# Patient Record
Sex: Female | Born: 1965 | Race: White | Hispanic: No | Smoking: Never smoker
Health system: Southern US, Community
[De-identification: ages and names within clinical notes are randomized; demographics above are authoritative.]

## PROBLEM LIST (undated history)

## (undated) DIAGNOSIS — R0600 Dyspnea, unspecified: Secondary | ICD-10-CM

## (undated) DIAGNOSIS — D649 Anemia, unspecified: Secondary | ICD-10-CM

## (undated) DIAGNOSIS — F32A Depression, unspecified: Secondary | ICD-10-CM

## (undated) DIAGNOSIS — M1990 Unspecified osteoarthritis, unspecified site: Secondary | ICD-10-CM

## (undated) DIAGNOSIS — E039 Hypothyroidism, unspecified: Secondary | ICD-10-CM

## (undated) DIAGNOSIS — G4730 Sleep apnea, unspecified: Secondary | ICD-10-CM

## (undated) DIAGNOSIS — F4310 Post-traumatic stress disorder, unspecified: Secondary | ICD-10-CM

## (undated) DIAGNOSIS — K769 Liver disease, unspecified: Secondary | ICD-10-CM

## (undated) DIAGNOSIS — E079 Disorder of thyroid, unspecified: Secondary | ICD-10-CM

## (undated) HISTORY — PX: HAND TENDON SURGERY: SHX663

## (undated) HISTORY — PX: CHOLECYSTECTOMY: SHX55

## (undated) HISTORY — PX: ABDOMINAL HYSTERECTOMY: SHX81

## (undated) HISTORY — PX: TONSILLECTOMY: SUR1361

---

## 2000-05-18 DIAGNOSIS — K769 Liver disease, unspecified: Secondary | ICD-10-CM

## 2000-05-18 HISTORY — DX: Liver disease, unspecified: K76.9

## 2017-08-20 DIAGNOSIS — K766 Portal hypertension: Secondary | ICD-10-CM | POA: Insufficient documentation

## 2018-04-11 HISTORY — PX: COLONOSCOPY: SHX5424

## 2018-05-18 DIAGNOSIS — K7460 Unspecified cirrhosis of liver: Secondary | ICD-10-CM

## 2018-05-18 HISTORY — DX: Unspecified cirrhosis of liver: K74.60

## 2018-09-29 DIAGNOSIS — R413 Other amnesia: Secondary | ICD-10-CM

## 2018-09-29 HISTORY — DX: Other amnesia: R41.3

## 2019-09-22 DIAGNOSIS — F418 Other specified anxiety disorders: Secondary | ICD-10-CM | POA: Diagnosis present

## 2019-10-24 DIAGNOSIS — K759 Inflammatory liver disease, unspecified: Secondary | ICD-10-CM

## 2019-10-24 HISTORY — DX: Inflammatory liver disease, unspecified: K75.9

## 2021-04-11 ENCOUNTER — Encounter (HOSPITAL_COMMUNITY): Payer: Self-pay

## 2021-04-11 ENCOUNTER — Emergency Department (HOSPITAL_COMMUNITY): Payer: Medicare Other

## 2021-04-11 ENCOUNTER — Other Ambulatory Visit: Payer: Self-pay

## 2021-04-11 ENCOUNTER — Inpatient Hospital Stay (HOSPITAL_COMMUNITY)
Admission: EM | Admit: 2021-04-11 | Discharge: 2021-04-15 | DRG: 441 | Disposition: A | Discharge status: Home / Self Care | Payer: Medicare Other | Attending: Internal Medicine | Admitting: Internal Medicine

## 2021-04-11 ENCOUNTER — Observation Stay (HOSPITAL_COMMUNITY): Payer: Medicare Other

## 2021-04-11 DIAGNOSIS — K7682 Hepatic encephalopathy: Principal | ICD-10-CM

## 2021-04-11 DIAGNOSIS — Z885 Allergy status to narcotic agent status: Secondary | ICD-10-CM | POA: Diagnosis not present

## 2021-04-11 DIAGNOSIS — R252 Cramp and spasm: Secondary | ICD-10-CM | POA: Diagnosis not present

## 2021-04-11 DIAGNOSIS — K754 Autoimmune hepatitis: Secondary | ICD-10-CM | POA: Diagnosis present

## 2021-04-11 DIAGNOSIS — Z79890 Hormone replacement therapy (postmenopausal): Secondary | ICD-10-CM | POA: Diagnosis not present

## 2021-04-11 DIAGNOSIS — R278 Other lack of coordination: Secondary | ICD-10-CM | POA: Diagnosis present

## 2021-04-11 DIAGNOSIS — I517 Cardiomegaly: Secondary | ICD-10-CM | POA: Diagnosis not present

## 2021-04-11 DIAGNOSIS — M1990 Unspecified osteoarthritis, unspecified site: Secondary | ICD-10-CM | POA: Diagnosis not present

## 2021-04-11 DIAGNOSIS — K7460 Unspecified cirrhosis of liver: Secondary | ICD-10-CM | POA: Diagnosis present

## 2021-04-11 DIAGNOSIS — D696 Thrombocytopenia, unspecified: Secondary | ICD-10-CM

## 2021-04-11 DIAGNOSIS — E876 Hypokalemia: Secondary | ICD-10-CM | POA: Diagnosis not present

## 2021-04-11 DIAGNOSIS — R410 Disorientation, unspecified: Secondary | ICD-10-CM | POA: Diagnosis not present

## 2021-04-11 DIAGNOSIS — Z90710 Acquired absence of both cervix and uterus: Secondary | ICD-10-CM | POA: Diagnosis not present

## 2021-04-11 DIAGNOSIS — Z8249 Family history of ischemic heart disease and other diseases of the circulatory system: Secondary | ICD-10-CM

## 2021-04-11 DIAGNOSIS — I959 Hypotension, unspecified: Secondary | ICD-10-CM | POA: Diagnosis present

## 2021-04-11 DIAGNOSIS — Z886 Allergy status to analgesic agent status: Secondary | ICD-10-CM

## 2021-04-11 DIAGNOSIS — Y92009 Unspecified place in unspecified non-institutional (private) residence as the place of occurrence of the external cause: Secondary | ICD-10-CM | POA: Diagnosis not present

## 2021-04-11 DIAGNOSIS — W19XXXA Unspecified fall, initial encounter: Secondary | ICD-10-CM | POA: Diagnosis present

## 2021-04-11 DIAGNOSIS — K7290 Hepatic failure, unspecified without coma: Secondary | ICD-10-CM | POA: Diagnosis not present

## 2021-04-11 DIAGNOSIS — E039 Hypothyroidism, unspecified: Secondary | ICD-10-CM | POA: Diagnosis present

## 2021-04-11 DIAGNOSIS — Z9109 Other allergy status, other than to drugs and biological substances: Secondary | ICD-10-CM

## 2021-04-11 DIAGNOSIS — R531 Weakness: Secondary | ICD-10-CM | POA: Diagnosis present

## 2021-04-11 DIAGNOSIS — I4891 Unspecified atrial fibrillation: Secondary | ICD-10-CM | POA: Diagnosis not present

## 2021-04-11 DIAGNOSIS — R296 Repeated falls: Secondary | ICD-10-CM | POA: Diagnosis not present

## 2021-04-11 DIAGNOSIS — R9431 Abnormal electrocardiogram [ECG] [EKG]: Secondary | ICD-10-CM | POA: Diagnosis not present

## 2021-04-11 DIAGNOSIS — S5011XA Contusion of right forearm, initial encounter: Secondary | ICD-10-CM | POA: Diagnosis not present

## 2021-04-11 DIAGNOSIS — D6959 Other secondary thrombocytopenia: Secondary | ICD-10-CM | POA: Diagnosis not present

## 2021-04-11 DIAGNOSIS — M79621 Pain in right upper arm: Secondary | ICD-10-CM | POA: Diagnosis not present

## 2021-04-11 DIAGNOSIS — U071 COVID-19: Secondary | ICD-10-CM | POA: Diagnosis present

## 2021-04-11 DIAGNOSIS — Z6841 Body Mass Index (BMI) 40.0 and over, adult: Secondary | ICD-10-CM | POA: Diagnosis not present

## 2021-04-11 DIAGNOSIS — I480 Paroxysmal atrial fibrillation: Secondary | ICD-10-CM | POA: Diagnosis not present

## 2021-04-11 DIAGNOSIS — D72810 Lymphocytopenia: Secondary | ICD-10-CM | POA: Diagnosis present

## 2021-04-11 HISTORY — DX: Disorder of thyroid, unspecified: E07.9

## 2021-04-11 HISTORY — DX: Post-traumatic stress disorder, unspecified: F43.10

## 2021-04-11 HISTORY — DX: Depression, unspecified: F32.A

## 2021-04-11 HISTORY — DX: Unspecified osteoarthritis, unspecified site: M19.90

## 2021-04-11 HISTORY — DX: Liver disease, unspecified: K76.9

## 2021-04-11 LAB — APTT: aPTT: 34 seconds (ref 24–36)

## 2021-04-11 LAB — URINALYSIS, ROUTINE W REFLEX MICROSCOPIC
Bacteria, UA: NONE SEEN
Bilirubin Urine: NEGATIVE
Glucose, UA: NEGATIVE mg/dL
Ketones, ur: NEGATIVE mg/dL
Leukocytes,Ua: NEGATIVE
Nitrite: NEGATIVE
Protein, ur: NEGATIVE mg/dL
Specific Gravity, Urine: 1.006 (ref 1.005–1.030)
pH: 6 (ref 5.0–8.0)

## 2021-04-11 LAB — CBC WITH DIFFERENTIAL/PLATELET
Abs Immature Granulocytes: 0.01 10*3/uL (ref 0.00–0.07)
Basophils Absolute: 0 10*3/uL (ref 0.0–0.1)
Basophils Relative: 1 %
Eosinophils Absolute: 0.1 10*3/uL (ref 0.0–0.5)
Eosinophils Relative: 6 %
HCT: 39.1 % (ref 36.0–46.0)
Hemoglobin: 13.3 g/dL (ref 12.0–15.0)
Immature Granulocytes: 0 %
Lymphocytes Relative: 27 %
Lymphs Abs: 0.6 10*3/uL — ABNORMAL LOW (ref 0.7–4.0)
MCH: 32.5 pg (ref 26.0–34.0)
MCHC: 34 g/dL (ref 30.0–36.0)
MCV: 95.6 fL (ref 80.0–100.0)
Monocytes Absolute: 0.3 10*3/uL (ref 0.1–1.0)
Monocytes Relative: 11 %
Neutro Abs: 1.3 10*3/uL — ABNORMAL LOW (ref 1.7–7.7)
Neutrophils Relative %: 55 %
Platelets: 108 10*3/uL — ABNORMAL LOW (ref 150–400)
RBC: 4.09 MIL/uL (ref 3.87–5.11)
RDW: 13.7 % (ref 11.5–15.5)
WBC: 2.3 10*3/uL — ABNORMAL LOW (ref 4.0–10.5)
nRBC: 0 % (ref 0.0–0.2)

## 2021-04-11 LAB — COMPREHENSIVE METABOLIC PANEL
ALT: 36 U/L (ref 0–44)
AST: 66 U/L — ABNORMAL HIGH (ref 15–41)
Albumin: 3 g/dL — ABNORMAL LOW (ref 3.5–5.0)
Alkaline Phosphatase: 76 U/L (ref 38–126)
Anion gap: 6 (ref 5–15)
BUN: 10 mg/dL (ref 6–20)
CO2: 24 mmol/L (ref 22–32)
Calcium: 8.1 mg/dL — ABNORMAL LOW (ref 8.9–10.3)
Chloride: 109 mmol/L (ref 98–111)
Creatinine, Ser: 1.02 mg/dL — ABNORMAL HIGH (ref 0.44–1.00)
GFR, Estimated: >60 mL/min (ref >60)
Glucose, Bld: 105 mg/dL — ABNORMAL HIGH (ref 70–99)
Potassium: 3.2 mmol/L — ABNORMAL LOW (ref 3.5–5.1)
Sodium: 139 mmol/L (ref 135–145)
Total Bilirubin: 1.8 mg/dL — ABNORMAL HIGH (ref 0.3–1.2)
Total Protein: 6.9 g/dL (ref 6.5–8.1)

## 2021-04-11 LAB — MAGNESIUM: Magnesium: 2.1 mg/dL (ref 1.7–2.4)

## 2021-04-11 LAB — RESP PANEL BY RT-PCR (FLU A&B, COVID) ARPGX2
Influenza A by PCR: NEGATIVE
Influenza B by PCR: NEGATIVE
SARS Coronavirus 2 by RT PCR: POSITIVE — AB

## 2021-04-11 LAB — PROTIME-INR
INR: 1.2 (ref 0.8–1.2)
Prothrombin Time: 15.1 seconds (ref 11.4–15.2)

## 2021-04-11 LAB — AMMONIA: Ammonia: 130 umol/L — ABNORMAL HIGH (ref 9–35)

## 2021-04-11 LAB — RAPID URINE DRUG SCREEN, HOSP PERFORMED
Amphetamines: NOT DETECTED
Barbiturates: NOT DETECTED
Benzodiazepines: NOT DETECTED
Cocaine: NOT DETECTED
Opiates: NOT DETECTED
Tetrahydrocannabinol: NOT DETECTED

## 2021-04-11 LAB — TSH: TSH: 3.974 u[IU]/mL (ref 0.350–4.500)

## 2021-04-11 LAB — LACTIC ACID, PLASMA: Lactic Acid, Venous: 1.6 mmol/L (ref 0.5–1.9)

## 2021-04-11 MED ORDER — LACTULOSE 10 GM/15ML PO SOLN
30.0000 g | Freq: Three times a day (TID) | ORAL | Status: DC
  Administered 2021-04-12 – 2021-04-15 (×10): 30 g via ORAL
  Filled 2021-04-11: qty 60
  Filled 2021-04-11 (×4): qty 45
  Filled 2021-04-11: qty 60
  Filled 2021-04-11 (×4): qty 45

## 2021-04-11 MED ORDER — LACTULOSE 10 GM/15ML PO SOLN
20.0000 g | Freq: Once | ORAL | Status: AC
  Administered 2021-04-11: 20 g via ORAL
  Filled 2021-04-11: qty 30

## 2021-04-11 MED ORDER — LEVOTHYROXINE SODIUM 150 MCG PO TABS
150.0000 ug | ORAL_TABLET | Freq: Every day | ORAL | Status: DC
  Administered 2021-04-12 – 2021-04-15 (×4): 150 ug via ORAL
  Filled 2021-04-11 (×2): qty 2
  Filled 2021-04-11: qty 1
  Filled 2021-04-11: qty 2

## 2021-04-11 MED ORDER — AZATHIOPRINE 50 MG PO TABS
50.0000 mg | ORAL_TABLET | Freq: Every day | ORAL | Status: DC
Start: 2021-04-12 — End: 2021-04-12
  Administered 2021-04-12: 50 mg via ORAL
  Filled 2021-04-11: qty 1

## 2021-04-11 MED ORDER — SODIUM CHLORIDE 0.9 % IV BOLUS
1000.0000 mL | Freq: Once | INTRAVENOUS | Status: AC
  Administered 2021-04-11: 1000 mL via INTRAVENOUS

## 2021-04-11 MED ORDER — RIFAXIMIN 200 MG PO TABS
200.0000 mg | ORAL_TABLET | Freq: Two times a day (BID) | ORAL | Status: DC
  Administered 2021-04-12 (×2): 200 mg via ORAL
  Filled 2021-04-11 (×2): qty 1

## 2021-04-11 MED ORDER — POTASSIUM CHLORIDE CRYS ER 20 MEQ PO TBCR
40.0000 meq | EXTENDED_RELEASE_TABLET | Freq: Once | ORAL | Status: AC
  Administered 2021-04-12: 40 meq via ORAL
  Filled 2021-04-11: qty 2

## 2021-04-11 NOTE — H&P (Signed)
History and Physical    Connie Larsen EXB:[id number] DOB: [birth year] DOA: 04/11/2021  PCP: System, Provider Not In  Patient coming from: Home.  Chief Complaint: Confusion.  History obtained from patient's daughter, patient and ER physician.  HPI: Connie Larsen is a 55 y.o. female with history of cirrhosis of liver and autoimmune hepatitis, hypothyroidism who has recently moved in from [place] to [place] was found this morning to be confused by patient's daughter.  Later in the evening when patient's daughter went to check on her again she was not talking well and had some slurred speech in addition to confusion.  At this point patient's daughter decided to bring patient to the ER.  Patient as per the daughter has not taken her medicines for last 3 days.  Has not had any nausea vomiting abdominal pain fever chills chest pain or shortness of breath.  ED Course: In the ER patient appears nonfocal CT head and MRI brain with nothing acute.  EKG shows A. fib which is new onset.  Patient is also incidentally positive for COVID.  Labs show ammonia level of 130 total bilirubin 1.8 AST of 66 platelets 108 and WBC 2.3.  Patient was started on lactulose admitted for acute hepatic encephalopathy.  Patient also was given potassium replacement for low potassium.  Review of Systems: As per HPI, rest all negative.   Past Medical History:  Diagnosis Date   Arthritis    Depression    Liver disease    PTSD (post-traumatic stress disorder)    Thyroid disease     Past Surgical History:  Procedure Laterality Date   ABDOMINAL HYSTERECTOMY       reports that she has never smoked. She has never used smokeless tobacco. She reports that she does not drink alcohol and does not use drugs.  Allergies  Allergen Reactions   Tramadol Hcl Anaphylaxis   Aspirin    Morphine And Related    Tylenol [Acetaminophen]     History reviewed. No pertinent family history.  Prior to Admission medications    Not on File    Physical Exam: Constitutional: Moderately built and nourished. Vitals:   04/11/21 1935 04/11/21 2000 04/11/21 2030 04/11/21 2100  BP: 117/76 (!) 132/116 139/67 127/84  Pulse: (!) 110 (!) 116 (!) 105 (!) 120  Resp: 18 16 14 20   Temp:      TempSrc:      SpO2: 100% 98% 100% 100%  Weight:      Height:       Eyes: Anicteric no pallor. ENMT: No discharge from the ears eyes nose and mouth. Neck: No mass felt.  No neck rigidity. Respiratory: No rhonchi or crepitations. Cardiovascular: S1-S2 heard. Abdomen: Soft nontender bowel sound present. Musculoskeletal: No edema. Skin: No rash. Neurologic: Alert awake oriented to her name.  Some difficulty talking.  Moves all extremities.  Pupils are equal and reactive to light. Psychiatric: Oriented to name.   Labs on Admission: I have personally reviewed following labs and imaging studies  CBC: Recent Labs  Lab 04/11/21 1741  WBC 2.3*  NEUTROABS 1.3*  HGB 13.3  HCT 39.1  MCV 95.6  PLT 108*   Basic Metabolic Panel: Recent Labs  Lab 04/11/21 1747  NA 139  K 3.2*  CL 109  CO2 24  GLUCOSE 105*  BUN 10  CREATININE 1.02*  CALCIUM 8.1*  MG 2.1   GFR: Estimated Creatinine Clearance: 91 mL/min (A) (by C-G formula based on SCr of 1.02 mg/dL (H)).  Liver Function Tests: Recent Labs  Lab 04/11/21 1747  AST 66*  ALT 36  ALKPHOS 76  BILITOT 1.8*  PROT 6.9  ALBUMIN 3.0*   No results for input(s): LIPASE, AMYLASE in the last 168 hours. Recent Labs  Lab 04/11/21 1746  AMMONIA 130*   Coagulation Profile: Recent Labs  Lab 04/11/21 1741  INR 1.2   Cardiac Enzymes: No results for input(s): CKTOTAL, CKMB, CKMBINDEX, TROPONINI in the last 168 hours. BNP (last 3 results) No results for input(s): PROBNP in the last 8760 hours. HbA1C: No results for input(s): HGBA1C in the last 72 hours. CBG: No results for input(s): GLUCAP in the last 168 hours. Lipid Profile: No results for input(s): CHOL, HDL, LDLCALC,  TRIG, CHOLHDL, LDLDIRECT in the last 72 hours. Thyroid Function Tests: Recent Labs    04/11/21 1741  TSH 3.974   Anemia Panel: No results for input(s): VITAMINB12, FOLATE, FERRITIN, TIBC, IRON, RETICCTPCT in the last 72 hours. Urine analysis:    Component Value Date/Time   COLORURINE YELLOW 04/11/2021 1746   APPEARANCEUR CLEAR 04/11/2021 1746   LABSPEC 1.006 04/11/2021 1746   PHURINE 6.0 04/11/2021 1746   GLUCOSEU NEGATIVE 04/11/2021 1746   HGBUR SMALL (A) 04/11/2021 1746   BILIRUBINUR NEGATIVE 04/11/2021 1746   KETONESUR NEGATIVE 04/11/2021 1746   PROTEINUR NEGATIVE 04/11/2021 1746   NITRITE NEGATIVE 04/11/2021 1746   LEUKOCYTESUR NEGATIVE 04/11/2021 1746   Sepsis Labs: @LABRCNTIP (procalcitonin:4,lacticidven:4) )No results found for this or any previous visit (from the past 240 hour(s)).   Radiological Exams on Admission: DG Chest 2 View  Result Date: 04/11/2021 CLINICAL DATA:  Altered mental status EXAM: CHEST - 2 VIEW COMPARISON:  None. FINDINGS: Borderline cardiomegaly. No focal opacity, pleural effusion or pneumothorax. IMPRESSION: No active cardiopulmonary disease. Electronically Signed   By: 04/13/2021 M.D.   On: 04/11/2021 19:10   CT HEAD WO CONTRAST (04/13/2021)  Result Date: 04/11/2021 CLINICAL DATA:  Delirium EXAM: CT HEAD WITHOUT CONTRAST TECHNIQUE: Contiguous axial images were obtained from the base of the skull through the vertex without intravenous contrast. COMPARISON:  None. FINDINGS: Brain: No evidence of acute infarction, hemorrhage, hydrocephalus, extra-axial collection or mass lesion/mass effect. Vascular: No hyperdense vessel or unexpected calcification. Skull: Normal. Negative for fracture or focal lesion. Sinuses/Orbits: No acute finding. Lobulated mucosal thickening in the left maxillary sinus. Other: None IMPRESSION: Negative non contrasted CT appearance of the brain Electronically Signed   By: 04/13/2021 M.D.   On: 04/11/2021 19:09    EKG:  Independently reviewed.  A. fib rate around 100 bpm.  Assessment/Plan Principal Problem:   Hepatic encephalopathy Active Problems:   Cirrhosis of liver (HCC)   Hypothyroidism    Hepatic encephalopathy with known history of cirrhosis of liver with history of autoimmune hepatitis -patient has been placed on lactulose and has not restarted on Xifaxan.  At this time patient is afebrile and no definite signs of any ascites.  Closely monitor mental status.  Follow metabolic panel. Mild hypokalemia potassium replacement was given.  Recheck metabolic panel. A. fib appears to be new onset.  Will check 2D echo.  Patient has had recent falls with large bruise on her right forearm.  Estrogen restart anticoagulation at this time.  We will keep patient on low-dose beta-blockers. COVID-19 positive incidentally positive patient afebrile.  We will keep patient on precautions.  Will start remdesivir if patient's daughter agrees. Hypothyroidism on Synthroid.  TSH is 3.9. Autoimmune hepatitis takes azathioprine. Mildly elevated AST likely from autoimmune hepatitis follow LFTs.  Leukopenia and thrombocytopenia could be from cirrhosis.  Follow CBC. Large bruise on the right arm from recent fall.  Will check x-ray.  We will get physical therapy consult.   DVT prophylaxis: SCDs. Code Status: Full code. Family Communication: Patient's daughter. Disposition Plan: Home. Consults called: Physical therapy. Admission status: Observation.   Eduard Clos MD [facility] Pager [phone].  If 7PM-7AM, please contact night-coverage [email or web address] Password [facility]  04/11/2021, 10:17 PM

## 2021-04-11 NOTE — ED Provider Notes (Addendum)
Emergency Medicine Provider Triage Evaluation Note  Connie Larsen , a 55 y.o. female  was evaluated in triage.  Pt complains of difficulty speaking.  Apparently called her daughter in 15 and this morning was not feeling right.  Proxy 1 hour prior to arrival she called her daughter stating she had abrupt onset of difficulty speaking.  She denies any unilateral weakness.  Has tremors to bilateral hands.  States she has not taken her thyroid medicine over the last 3 days.  Hx of liver disease. On lactulose   Review of Systems  Positive: Difficulty speaking, tremors Negative:   Physical Exam  BP (!) 144/95 (BP Location: Left Arm)   Pulse (!) 127   Temp 97.9 F (36.6 C) (Oral)   Resp 18   Ht 5\' 7"  (1.702 m)   Wt 136.1 kg   SpO2 99%   BMI 46.99 kg/m  Gen:   Awake, no distress   Resp:  Normal effort  MSK:   Moves extremities without difficulty, tremors to bilateral upper extremities Neuro:  Slight weakness to upper extremities Other:    Medical Decision Making  Medically screening exam initiated at 5:41 PM.  Appropriate orders placed.  was informed that the remainder of the evaluation will be completed by another provider, this initial triage assessment does not replace that evaluation, and the importance of remaining in the ED until their evaluation is complete.  Attending Dr. Elana Alm to bedside to assess   No code stroke with get AMS WU    Charm Barges A, PA-C 04/11/21 1747    04/13/21, MD 04/12/21 1047

## 2021-04-11 NOTE — ED Provider Notes (Signed)
[facility]-EMERGENCY DEPT Provider Note   CSN: [id number] Arrival date & time: 04/11/21  1718     History No chief complaint on file.   Connie Larsen is a 55 y.o. female.  Pt presents to the ED today with difficulty speaking.  Pt lives alone and has had sx for the past day.  The pt's daughter called her this morning and pt was having some trouble speaking.  Later, the daughter called back and sx were worse, so she brought her here.  Pt has not taken her thyroid medication in 3 days because she forgot to take it.  Per daughter, pt has been like this in the past when she's forgotten to take her thyroid meds.  Pt feels numbness in both hands.  Daughter said she looked off balance when she walked.    Pt has recently moved here from [place] and has never been here.  Luckily, her daughter has brought an updated med list.  It looks like pt has a hx of liver problems.      Past Medical History:  Diagnosis Date   Arthritis    Depression    Liver disease    PTSD (post-traumatic stress disorder)    Stroke [facility])    Thyroid disease     Patient Active Problem List   Diagnosis Date Noted   Hepatic encephalopathy 04/11/2021     History reviewed. No pertinent surgical history.   OB History   No obstetric history on file.     History reviewed. No pertinent family history.  Social History   Tobacco Use   Smoking status: Never   Smokeless tobacco: Never  Vaping Use   Vaping Use: Never used  Substance Use Topics   Alcohol use: Never   Drug use: Never    Home Medications Prior to Admission medications   Not on File     Allergies    Tramadol hcl, Aspirin, Morphine and related, and Tylenol [acetaminophen]  Review of Systems   Review of Systems  Neurological:  Positive for dizziness, speech difficulty and weakness.  All other systems reviewed and are negative.  Physical Exam Updated Vital Signs BP (!) 132/116   Pulse (!) 116   Temp 97.9 F  (36.6 C) (Oral)   Resp 16   Ht 5\' 7"  (1.702 m)   Wt 136.1 kg   SpO2 98%   BMI 46.99 kg/m   Physical Exam Vitals and nursing note reviewed.  Constitutional:      Appearance: Normal appearance.  HENT:     Head: Normocephalic and atraumatic.     Right Ear: External ear normal.     Left Ear: External ear normal.     Nose: Nose normal.     Mouth/Throat:     Mouth: Mucous membranes are dry.  Eyes:     Extraocular Movements: Extraocular movements intact.     Conjunctiva/sclera: Conjunctivae normal.     Pupils: Pupils are equal, round, and reactive to light.  Cardiovascular:     Rate and Rhythm: Regular rhythm. Tachycardia present.     Pulses: Normal pulses.     Heart sounds: Normal heart sounds.  Pulmonary:     Effort: Pulmonary effort is normal.     Breath sounds: Normal breath sounds.  Abdominal:     General: Abdomen is flat. Bowel sounds are normal.     Palpations: Abdomen is soft.  Musculoskeletal:        General: Normal range of motion.  Cervical back: Normal range of motion and neck supple.  Skin:    General: Skin is warm.     Capillary Refill: Capillary refill takes less than 2 seconds.  Neurological:     General: No focal deficit present.     Mental Status: She is alert and oriented to person, place, and time.     Comments: Pt is able to speak, but it takes her a long time to get out her words.  Tremors in both arms.  Psychiatric:        Mood and Affect: Mood normal.    ED Results / Procedures / Treatments   Labs (all labs ordered are listed, but only abnormal results are displayed) Labs Reviewed  CBC WITH DIFFERENTIAL/PLATELET - Abnormal; Notable for the following components:      Result Value   WBC 2.3 (*)    Platelets 108 (*)    Neutro Abs 1.3 (*)    Lymphs Abs 0.6 (*)    All other components within normal limits  AMMONIA - Abnormal; Notable for the following components:   Ammonia 130 (*)    All other components within normal limits   COMPREHENSIVE METABOLIC PANEL - Abnormal; Notable for the following components:   Potassium 3.2 (*)    Glucose, Bld 105 (*)    Creatinine, Ser 1.02 (*)    Calcium 8.1 (*)    Albumin 3.0 (*)    AST 66 (*)    Total Bilirubin 1.8 (*)    All other components within normal limits  RESP PANEL BY RT-PCR (FLU A&B, COVID) ARPGX2  PROTIME-INR  APTT  TSH  LACTIC ACID, PLASMA  T4, FREE  URINALYSIS, ROUTINE W REFLEX MICROSCOPIC  RAPID URINE DRUG SCREEN, HOSP PERFORMED  LACTIC ACID, PLASMA  MAGNESIUM    EKG EKG Interpretation  Date/Time:  Friday April 11 2021 18:58:35 EST Ventricular Rate:  113 PR Interval:    QRS Duration: 91 QT Interval:  361 QTC Calculation: 495 R Axis:   76 Text Interpretation: Atrial fibrillation Ventricular premature complex Low voltage, precordial leads Borderline T abnormalities, diffuse leads Borderline prolonged QT interval No old tracing to compare Confirmed by Isla Pence [phone]) on 04/11/2021 7:36:03 PM  Radiology DG Chest 2 View  Result Date: 04/11/2021 CLINICAL DATA:  Altered mental status EXAM: CHEST - 2 VIEW COMPARISON:  None. FINDINGS: Borderline cardiomegaly. No focal opacity, pleural effusion or pneumothorax. IMPRESSION: No active cardiopulmonary disease. Electronically Signed   By: Donavan Foil M.D.   On: 04/11/2021 19:10   CT HEAD WO CONTRAST (5MM)  Result Date: 04/11/2021 CLINICAL DATA:  Delirium EXAM: CT HEAD WITHOUT CONTRAST TECHNIQUE: Contiguous axial images were obtained from the base of the skull through the vertex without intravenous contrast. COMPARISON:  None. FINDINGS: Brain: No evidence of acute infarction, hemorrhage, hydrocephalus, extra-axial collection or mass lesion/mass effect. Vascular: No hyperdense vessel or unexpected calcification. Skull: Normal. Negative for fracture or focal lesion. Sinuses/Orbits: No acute finding. Lobulated mucosal thickening in the left maxillary sinus. Other: None IMPRESSION: Negative non  contrasted CT appearance of the brain Electronically Signed   By: Donavan Foil M.D.   On: 04/11/2021 19:09    Procedures Procedures   Medications Ordered in ED Medications  potassium chloride SA (KLOR-CON) CR tablet 40 mEq (has no administration in time range)  lactulose (CHRONULAC) 10 GM/15ML solution 20 g (has no administration in time range)  sodium chloride 0.9 % bolus 1,000 mL (1,000 mLs Intravenous New Bag/Given 04/11/21 1935)    ED  Course  I have reviewed the triage vital signs and the nursing notes.  Pertinent labs & imaging results that were available during my care of the patient were reviewed by me and considered in my medical decision making (see chart for details).    MDM Rules/Calculators/A&P                           Pt's AMS is due to hepatic encephalopathy.  No lateralizing symptoms to make me think she needs a MRI to r/o stroke.  CT neg.  Pt given lactulose for her elevated ammonia.  K is low, so she is given Kdur as well.  Pt is d/w Dr. Hal Hope ([facility]) for admission.   Final Clinical Impression(s) / ED Diagnoses Final diagnoses:  Hepatic encephalopathy  Hypokalemia  Thrombocytopenia ([place])    Rx / DC Orders ED Discharge Orders     None        Isla Pence, MD 04/11/21 2044

## 2021-04-11 NOTE — ED Triage Notes (Signed)
Patient's daughter reports that the patient called her and she noted that she could not understand what the patient was trying to say. Patient is lethargic and can not get her words out. Patient's daughter states that her mother did say she took her medicine wrong.  Patient states after much trying -did say she has not taken her thyroid medicine in a few days.

## 2021-04-12 ENCOUNTER — Observation Stay (HOSPITAL_COMMUNITY): Payer: Medicare Other

## 2021-04-12 ENCOUNTER — Inpatient Hospital Stay (HOSPITAL_COMMUNITY): Payer: Medicare Other

## 2021-04-12 DIAGNOSIS — R252 Cramp and spasm: Secondary | ICD-10-CM | POA: Diagnosis present

## 2021-04-12 DIAGNOSIS — W19XXXA Unspecified fall, initial encounter: Secondary | ICD-10-CM | POA: Diagnosis present

## 2021-04-12 DIAGNOSIS — S5011XA Contusion of right forearm, initial encounter: Secondary | ICD-10-CM | POA: Diagnosis present

## 2021-04-12 DIAGNOSIS — Z886 Allergy status to analgesic agent status: Secondary | ICD-10-CM | POA: Diagnosis not present

## 2021-04-12 DIAGNOSIS — I480 Paroxysmal atrial fibrillation: Secondary | ICD-10-CM | POA: Diagnosis not present

## 2021-04-12 DIAGNOSIS — D6959 Other secondary thrombocytopenia: Secondary | ICD-10-CM | POA: Diagnosis present

## 2021-04-12 DIAGNOSIS — Z90710 Acquired absence of both cervix and uterus: Secondary | ICD-10-CM | POA: Diagnosis not present

## 2021-04-12 DIAGNOSIS — M1990 Unspecified osteoarthritis, unspecified site: Secondary | ICD-10-CM | POA: Diagnosis present

## 2021-04-12 DIAGNOSIS — Z79890 Hormone replacement therapy (postmenopausal): Secondary | ICD-10-CM | POA: Diagnosis not present

## 2021-04-12 DIAGNOSIS — I959 Hypotension, unspecified: Secondary | ICD-10-CM | POA: Diagnosis present

## 2021-04-12 DIAGNOSIS — D72810 Lymphocytopenia: Secondary | ICD-10-CM | POA: Diagnosis present

## 2021-04-12 DIAGNOSIS — Z8249 Family history of ischemic heart disease and other diseases of the circulatory system: Secondary | ICD-10-CM | POA: Diagnosis not present

## 2021-04-12 DIAGNOSIS — K7682 Hepatic encephalopathy: Secondary | ICD-10-CM | POA: Diagnosis present

## 2021-04-12 DIAGNOSIS — Z885 Allergy status to narcotic agent status: Secondary | ICD-10-CM | POA: Diagnosis not present

## 2021-04-12 DIAGNOSIS — Z6841 Body Mass Index (BMI) 40.0 and over, adult: Secondary | ICD-10-CM | POA: Diagnosis not present

## 2021-04-12 DIAGNOSIS — R531 Weakness: Secondary | ICD-10-CM | POA: Diagnosis present

## 2021-04-12 DIAGNOSIS — Z9109 Other allergy status, other than to drugs and biological substances: Secondary | ICD-10-CM | POA: Diagnosis not present

## 2021-04-12 DIAGNOSIS — M79621 Pain in right upper arm: Secondary | ICD-10-CM | POA: Diagnosis not present

## 2021-04-12 DIAGNOSIS — R278 Other lack of coordination: Secondary | ICD-10-CM | POA: Diagnosis present

## 2021-04-12 DIAGNOSIS — K7460 Unspecified cirrhosis of liver: Secondary | ICD-10-CM | POA: Diagnosis present

## 2021-04-12 DIAGNOSIS — E039 Hypothyroidism, unspecified: Secondary | ICD-10-CM | POA: Diagnosis present

## 2021-04-12 DIAGNOSIS — U071 COVID-19: Secondary | ICD-10-CM | POA: Diagnosis present

## 2021-04-12 DIAGNOSIS — E876 Hypokalemia: Secondary | ICD-10-CM | POA: Diagnosis present

## 2021-04-12 DIAGNOSIS — R296 Repeated falls: Secondary | ICD-10-CM | POA: Diagnosis present

## 2021-04-12 DIAGNOSIS — Y92009 Unspecified place in unspecified non-institutional (private) residence as the place of occurrence of the external cause: Secondary | ICD-10-CM | POA: Diagnosis not present

## 2021-04-12 DIAGNOSIS — K754 Autoimmune hepatitis: Secondary | ICD-10-CM | POA: Diagnosis present

## 2021-04-12 DIAGNOSIS — I4891 Unspecified atrial fibrillation: Secondary | ICD-10-CM

## 2021-04-12 LAB — ECHOCARDIOGRAM COMPLETE
AR max vel: 1.72 cm2
AV Area VTI: 1.58 cm2
AV Area mean vel: 1.75 cm2
AV Mean grad: 4.7 mmHg
AV Peak grad: 8.2 mmHg
Ao pk vel: 1.43 m/s
Area-P 1/2: 3.03 cm2
Height: 67 in
S' Lateral: 3.5 cm
Weight: 4800 oz

## 2021-04-12 LAB — BASIC METABOLIC PANEL
Anion gap: 7 (ref 5–15)
BUN: 8 mg/dL (ref 6–20)
CO2: 22 mmol/L (ref 22–32)
Calcium: 8 mg/dL — ABNORMAL LOW (ref 8.9–10.3)
Chloride: 110 mmol/L (ref 98–111)
Creatinine, Ser: 0.77 mg/dL (ref 0.44–1.00)
GFR, Estimated: >60 mL/min (ref >60)
Glucose, Bld: 91 mg/dL (ref 70–99)
Potassium: 3.4 mmol/L — ABNORMAL LOW (ref 3.5–5.1)
Sodium: 139 mmol/L (ref 135–145)

## 2021-04-12 LAB — CBC WITH DIFFERENTIAL/PLATELET
Abs Immature Granulocytes: 0.01 10*3/uL (ref 0.00–0.07)
Basophils Absolute: 0 10*3/uL (ref 0.0–0.1)
Basophils Relative: 1 %
Eosinophils Absolute: 0.2 10*3/uL (ref 0.0–0.5)
Eosinophils Relative: 8 %
HCT: 37.7 % (ref 36.0–46.0)
Hemoglobin: 12.9 g/dL (ref 12.0–15.0)
Immature Granulocytes: 0 %
Lymphocytes Relative: 35 %
Lymphs Abs: 1 10*3/uL (ref 0.7–4.0)
MCH: 32.8 pg (ref 26.0–34.0)
MCHC: 34.2 g/dL (ref 30.0–36.0)
MCV: 95.9 fL (ref 80.0–100.0)
Monocytes Absolute: 0.3 10*3/uL (ref 0.1–1.0)
Monocytes Relative: 11 %
Neutro Abs: 1.3 10*3/uL — ABNORMAL LOW (ref 1.7–7.7)
Neutrophils Relative %: 45 %
Platelets: 112 10*3/uL — ABNORMAL LOW (ref 150–400)
RBC: 3.93 MIL/uL (ref 3.87–5.11)
RDW: 13.8 % (ref 11.5–15.5)
WBC: 2.8 10*3/uL — ABNORMAL LOW (ref 4.0–10.5)
nRBC: 0 % (ref 0.0–0.2)

## 2021-04-12 LAB — HEPATIC FUNCTION PANEL
ALT: 33 U/L (ref 0–44)
AST: 59 U/L — ABNORMAL HIGH (ref 15–41)
Albumin: 2.8 g/dL — ABNORMAL LOW (ref 3.5–5.0)
Alkaline Phosphatase: 68 U/L (ref 38–126)
Bilirubin, Direct: 0.4 mg/dL — ABNORMAL HIGH (ref 0.0–0.2)
Indirect Bilirubin: 1.6 mg/dL — ABNORMAL HIGH (ref 0.3–0.9)
Total Bilirubin: 2 mg/dL — ABNORMAL HIGH (ref 0.3–1.2)
Total Protein: 6.2 g/dL — ABNORMAL LOW (ref 6.5–8.1)

## 2021-04-12 LAB — T4, FREE: Free T4: 0.86 ng/dL (ref 0.61–1.12)

## 2021-04-12 LAB — AMMONIA: Ammonia: 144 umol/L — ABNORMAL HIGH (ref 9–35)

## 2021-04-12 LAB — D-DIMER, QUANTITATIVE: D-Dimer, Quant: 0.99 ug/mL-FEU — ABNORMAL HIGH (ref 0.00–0.50)

## 2021-04-12 MED ORDER — MOLNUPIRAVIR EUA 200MG CAPSULE
4.0000 | ORAL_CAPSULE | Freq: Two times a day (BID) | ORAL | Status: DC
  Administered 2021-04-12 – 2021-04-15 (×6): 800 mg via ORAL
  Filled 2021-04-12 (×2): qty 4

## 2021-04-12 MED ORDER — METOPROLOL TARTRATE 12.5 MG HALF TABLET
12.5000 mg | ORAL_TABLET | Freq: Two times a day (BID) | ORAL | Status: DC
  Administered 2021-04-12 – 2021-04-14 (×4): 12.5 mg via ORAL
  Filled 2021-04-12 (×5): qty 1

## 2021-04-12 MED ORDER — NIRMATRELVIR/RITONAVIR (PAXLOVID)TABLET: 3.0000 | ORAL_TABLET | Freq: Two times a day (BID) | ORAL | Status: DC

## 2021-04-12 MED ORDER — AZATHIOPRINE 50 MG PO TABS
100.0000 mg | ORAL_TABLET | Freq: Every day | ORAL | Status: DC
  Administered 2021-04-13 – 2021-04-15 (×3): 100 mg via ORAL
  Filled 2021-04-12 (×3): qty 2

## 2021-04-12 MED ORDER — RIFAXIMIN 550 MG PO TABS
550.0000 mg | ORAL_TABLET | Freq: Two times a day (BID) | ORAL | Status: DC
  Administered 2021-04-12 – 2021-04-15 (×6): 550 mg via ORAL
  Filled 2021-04-12 (×7): qty 1

## 2021-04-12 NOTE — Progress Notes (Signed)
PT Note  Patient Details Name: Connie Larsen MRN: [id number] DOB: Sep 26, 1965      Pt has PT order to evaluate. Currently in ED and continuing to get medical assessments and treatments. Will check back  on pt this afternoon for assessment to allow time for treatment and/or admission to the unit.    [name] 04/12/2021, 9:50 AM Clois Dupes, PT, MPT Acute Rehabilitation Services Office: [phone] Pager: [phone] 04/12/2021

## 2021-04-12 NOTE — Evaluation (Signed)
Physical Therapy Evaluation Patient Details Name: Connie Larsen MRN: [id number] DOB: 1966-05-15 Today's Date: 04/12/2021  History of Present Illness  Connie Larsen is a 55 y.o. female with history of cirrhosis of liver and autoimmune hepatitis, hypothyroidism who has recently moved in from [place] to [place] was found this morning to be confused by patient's daughter.  Later in the evening when patient's daughter went to check on her again she was not talking well and had some slurred speech in addition to confusion. also found to be COVID +  Clinical Impression  Pt tolerated mobilizing in room well during assessment. No LOB or dizziness reported, was able to bend over and  simulate getting items off floor. Mobility at this time is back to baseline , no further PT acute needs or PT upon DC.  While here recommend Mobility specialist and/or nursing to continue to mobilize patient.       Recommendations for follow up therapy are one component of a multi-disciplinary discharge planning process, led by the attending physician.  Recommendations may be updated based on patient status, additional functional criteria and insurance authorization.  Follow Up Recommendations No PT follow up    Assistance Recommended at Discharge PRN  Functional Status Assessment Patient has had a recent decline in their functional status and demonstrates the ability to make significant improvements in function in a reasonable and predictable amount of time.  Equipment Recommendations  None recommended by PT    Recommendations for Other Services       Precautions / Restrictions Precautions Precautions: Fall Precaution Comments: pt reported she has fallen 3 times at home      Mobility  Bed Mobility Overal bed mobility: Modified Independent                  Transfers Overall transfer level: Modified independent                      Ambulation/Gait Ambulation/Gait assistance: Min  guard Gait Distance (Feet): 40 Feet Assistive device: None         General Gait Details: walked around in room for several minutes turning and walking, with no LOB or dizziness. Pt stated she felt she was getting back to normal with her mobility. and had no concerns  Information systems managertairs            Wheelchair Mobility    Modified Rankin (Stroke Patients Only)       Balance Overall balance assessment: No apparent balance deficits (not formally assessed)                                           Pertinent Vitals/Pain Pain Assessment: No/denies pain    Home Living Family/patient expects to be discharged to:: Private residence Living Arrangements: Alone Available Help at Discharge: Family Type of Home: House Home Access: Level entry       Home Layout: One level Home Equipment: None      Prior Function Prior Level of Function : Independent/Modified Independent             Mobility Comments: pt reports she is independent with everything however does not drive. Dtr checks on pt however she admits she has not been great with organizing her medicines so her dtr is helping to get a plan for that.       Hand Dominance  Extremity/Trunk Assessment        Lower Extremity Assessment Lower Extremity Assessment: Overall WFL for tasks assessed       Communication   Communication: No difficulties  Cognition Arousal/Alertness: Awake/alert Behavior During Therapy: WFL for tasks assessed/performed Overall Cognitive Status: Within Functional Limits for tasks assessed                                          General Comments      Exercises     Assessment/Plan    PT Assessment Patient does not need any further PT services  PT Problem List         PT Treatment Interventions      PT Goals (Current goals can be found in the Care Plan section)  Acute Rehab PT Goals Patient Stated Goal: I want to feel better and go home when I  can PT Goal Formulation: All assessment and education complete, DC therapy    Frequency     Barriers to discharge        Co-evaluation               AM-PAC PT "6 Clicks" Mobility  Outcome Measure Help needed turning from your back to your side while in a flat bed without using bedrails?: None Help needed moving from lying on your back to sitting on the side of a flat bed without using bedrails?: None Help needed moving to and from a bed to a chair (including a wheelchair)?: None Help needed standing up from a chair using your arms (e.g., wheelchair or bedside chair)?: None Help needed to walk in hospital room?: None Help needed climbing 3-5 steps with a railing? : A Little 6 Click Score: 23    End of Session   Activity Tolerance: Patient tolerated treatment well Patient left: in bed;with call bell/phone within reach Nurse Communication: Mobility status PT Visit Diagnosis: Other abnormalities of gait and mobility (R26.89)    Time: 4580-9983 PT Time Calculation (min) (ACUTE ONLY): 23 min   Charges:   PT Evaluation $PT Eval Low Complexity: 1 Low          Olita Takeshita, PT, MPT Acute Rehabilitation Services Office: [phone] Pager: [phone] 04/12/2021   [name] 04/12/2021, 5:10 PM

## 2021-04-12 NOTE — Progress Notes (Signed)
Pt arrived from ED to room 1508.

## 2021-04-12 NOTE — Progress Notes (Addendum)
PROGRESS NOTE    Connie Larsen  [id number] DOB: June 14, 1965 DOA: 04/11/2021 PCP: System, Provider Not In    No chief complaint on file.   Brief Narrative:   Connie Larsen is a 55 y.o. female with history of cirrhosis of liver and autoimmune hepatitis, hypothyroidism who has recently moved in from [place] to [place] was found this morning to be confused by patient's daughter Found to be in [place] on presentation, elevated ammonia level and tested + for covid  Subjective:  She is slightly confused, slow speech, + asterixis There is no fever, she is on room air, I do not hear cough There is no edema She report have some left sided pain in the last month, also report intermittent cramping in legs and last month Report she had ascites in the past but her abdomen is pretty good today, denies abdominal pain  Assessment & Plan:   Principal Problem:   Hepatic encephalopathy Active Problems:   Cirrhosis of liver ([place])   Hypothyroidism   Hepatic encephalopathy -CT had MRI brain no acute findings -Elevated ammonia level -No abdominal pain, abdomen soft, will get abdominal ultrasound to rule out ascites -Continue lactulose and rifaximin  New onset of afib/presented with A. fib RVR Is started on beta-blocker, heart rate controlled, he is not a candidate for anticoagulation due to fall risk also has thrombocytopenia Her TSH is 3.9 Keep on telemetry, keep K above 4, mag above 2  COVID infection -No hypoxia, chest x-ray no acute findings, lung exam clears -Most likely incidental COVID infection due to risk factor plan to treat,  remdesivir may not be a good due to underlying liver disease , will discuss with pharmacy regarding oral antiviral Monitor inflammatory markers, O2 sats  History of autoimmune hepatitis, takes azathioprine 50 mg by mouth daily appear also on prednisone 10 mg daily (daughter brought in her home med list), pic taken by EDP, please see med list in  EDP note P.m. addendum, patient mental status has improved in afternoon, she verified that she is on azathioprine 100 mg daily, she report is tapered off steroid.  Mild elevated AST, assume from autoimmune hepatitis Unclear baseline Trend  Lymphopenia and thrombocytopenia could be from cirrhosis, monitor CBC  Hypothyroidism: Continue Synthroid  :History of fall with large bruise on right arm from recent fall, right humerus x-ray no acute findings PT will see   Class III obesity: Body mass index is 46.99 kg/m.Marland Kitchen       Unresulted Labs (From admission, onward)     Start     Ordered   04/13/21 0500  CBC with Differential/Platelet  Daily,   R      04/12/21 0647   04/13/21 0500  Comprehensive metabolic panel  Daily,   R      04/12/21 0647   04/13/21 0500  C-reactive protein  Daily,   R      04/12/21 0647   04/13/21 0500  D-dimer, quantitative  Daily,   R      04/12/21 0647   04/13/21 0500  Magnesium  Tomorrow morning,   STAT        04/12/21 1603   04/12/21 0648  C-reactive protein  Once,   R        04/12/21 0647   04/11/21 2215  HIV Antibody (routine testing w rflx)  (HIV Antibody (Routine testing w reflex) panel)  Once,   R        04/11/21 2216  DVT prophylaxis: SCDs Start: 04/11/21 2215   Code Status: Full Family Communication: Daughter and son over the phone Disposition:   Status is: Inpatient  Dispo: The patient is from: Home              Anticipated d/c is to: To be determined              Anticipated d/c date is: TBD               Consultants:  None  Procedures:  None  Antimicrobials:   Anti-infectives (From admission, onward)    Start     Dose/Rate Route Frequency Ordered Stop   04/12/21 2200  rifaximin (XIFAXAN) tablet 550 mg        550 mg Oral 2 times daily 04/12/21 1337     04/11/21 2300  rifaximin (XIFAXAN) tablet 200 mg  Status:  Discontinued        200 mg Oral 2 times daily 04/11/21 2219 04/12/21 1337            Objective: Vitals:   04/12/21 0607 04/12/21 0630 04/12/21 0700 04/12/21 1000  BP: (!) 147/94 109/68 109/66 (!) 113/46  Pulse: (!) 104 84 80 (!) 103  Resp: 13 17 12 12   Temp:      TempSrc:      SpO2: 95% 99% 98% 99%  Weight:      Height:       No intake or output data in the 24 hours ending 04/12/21 1627 Filed Weights   04/11/21 1728  Weight: 136.1 kg    Examination:  General exam: Lethargic , slightly confused but able to self-correct , slow speech , but able to provide some history , + asterixis Respiratory system: Clear to auscultation. Respiratory effort normal. Cardiovascular system:  RRR.  Gastrointestinal system: Protuberant abdomen , but soft , nontender .  Normal bowel sounds heard. Central nervous system: Lethargic, slightly confused  Extremities:  no edema Skin: Ecchymosis on arm Psychiatry: Lethargic, cooperative, no agitation    Data Reviewed: I have personally reviewed following labs and imaging studies  CBC: Recent Labs  Lab 04/11/21 1741 04/12/21 0630  WBC 2.3* 2.8*  NEUTROABS 1.3* 1.3*  HGB 13.3 12.9  HCT 39.1 37.7  MCV 95.6 95.9  PLT 108* 112*    Basic Metabolic Panel: Recent Labs  Lab 04/11/21 1747 04/12/21 0630  NA 139 139  K 3.2* 3.4*  CL 109 110  CO2 24 22  GLUCOSE 105* 91  BUN 10 8  CREATININE 1.02* 0.77  CALCIUM 8.1* 8.0*  MG 2.1  --     GFR: Estimated Creatinine Clearance: 116 mL/min (by C-G formula based on SCr of 0.77 mg/dL).  Liver Function Tests: Recent Labs  Lab 04/11/21 1747 04/12/21 0630  AST 66* 59*  ALT 36 33  ALKPHOS 76 68  BILITOT 1.8* 2.0*  PROT 6.9 6.2*  ALBUMIN 3.0* 2.8*    CBG: No results for input(s): GLUCAP in the last 168 hours.   Recent Results (from the past 240 hour(s))  Resp Panel by RT-PCR (Flu A&B, Covid) Urine, Clean Catch     Status: Abnormal   Collection Time: 04/11/21  8:19 PM   Specimen: Urine, Clean Catch; Nasopharyngeal(NP) swabs in vial transport medium  Result  Value Ref Range Status   SARS Coronavirus 2 by RT PCR POSITIVE (A) NEGATIVE Final    Comment: RESULT CALLED TO, READ BACK BY AND VERIFIED WITH: ELLWANGER,A. 04/11/21 @22 :52 BY SEEL,M. (NOTE) SARS-CoV-2 target nucleic  acids are DETECTED.  The SARS-CoV-2 RNA is generally detectable in upper respiratory specimens during the acute phase of infection. Positive results are indicative of the presence of the identified virus, but do not rule out bacterial infection or co-infection with other pathogens not detected by the test. Clinical correlation with patient history and other diagnostic information is necessary to determine patient infection status. The expected result is Negative.  Fact Sheet for Patients: EntrepreneurPulse.com.au  Fact Sheet for Healthcare Providers: IncredibleEmployment.be  This test is not yet approved or cleared by the [place] FDA and  has been authorized for detection and/or diagnosis of SARS-CoV-2 by FDA under an Emergency Use Authorization (EUA).  This EUA will remain in effect (meaning this test  can be used) for the duration of  the COVID-19 declaration under Section 564(b)(1) of the Act, 21 U.S.C. section 360bbb-3(b)(1), unless the authorization is terminated or revoked sooner.     Influenza A by PCR NEGATIVE NEGATIVE Final   Influenza B by PCR NEGATIVE NEGATIVE Final    Comment: (NOTE) The Xpert Xpress SARS-CoV-2/FLU/RSV plus assay is intended as an aid in the diagnosis of influenza from Nasopharyngeal swab specimens and should not be used as a sole basis for treatment. Nasal washings and aspirates are unacceptable for Xpert Xpress SARS-CoV-2/FLU/RSV testing.  Fact Sheet for Patients: EntrepreneurPulse.com.au  Fact Sheet for Healthcare Providers: IncredibleEmployment.be  This test is not yet approved or cleared by the [place] FDA and has been authorized for detection  and/or diagnosis of SARS-CoV-2 by FDA under an Emergency Use Authorization (EUA). This EUA will remain in effect (meaning this test can be used) for the duration of the COVID-19 declaration under Section 564(b)(1) of the Act, 21 U.S.C. section 360bbb-3(b)(1), unless the authorization is terminated or revoked.  Performed at [facility], [place] [address], [place], [place] [address]          Radiology Studies: DG Chest 2 View  Result Date: 04/11/2021 CLINICAL DATA:  Altered mental status EXAM: CHEST - 2 VIEW COMPARISON:  None. FINDINGS: Borderline cardiomegaly. No focal opacity, pleural effusion or pneumothorax. IMPRESSION: No active cardiopulmonary disease. Electronically Signed   By: Donavan Foil M.D.   On: 04/11/2021 19:10   CT HEAD WO CONTRAST (5MM)  Result Date: 04/11/2021 CLINICAL DATA:  Delirium EXAM: CT HEAD WITHOUT CONTRAST TECHNIQUE: Contiguous axial images were obtained from the base of the skull through the vertex without intravenous contrast. COMPARISON:  None. FINDINGS: Brain: No evidence of acute infarction, hemorrhage, hydrocephalus, extra-axial collection or mass lesion/mass effect. Vascular: No hyperdense vessel or unexpected calcification. Skull: Normal. Negative for fracture or focal lesion. Sinuses/Orbits: No acute finding. Lobulated mucosal thickening in the left maxillary sinus. Other: None IMPRESSION: Negative non contrasted CT appearance of the brain Electronically Signed   By: Donavan Foil M.D.   On: 04/11/2021 19:09   MR BRAIN WO CONTRAST  Result Date: 04/11/2021 CLINICAL DATA:  Delirium, stroke suspected EXAM: MRI HEAD WITHOUT CONTRAST TECHNIQUE: Multiplanar, multiecho pulse sequences of the brain and surrounding structures were obtained without intravenous contrast. COMPARISON:  No prior MRI, correlation is made with CT head 04/11/2021. FINDINGS: Brain: No restricted diffusion to suggest acute or subacute infarct. No acute hemorrhage mass  mass effect, or midline shift. No extra-axial collection or hydrocephalus. No foci of hemosiderin deposition to suggest remote hemorrhage. Minimal T2 hyperintense signal in the periventricular white matter, likely the sequela of chronic small vessel ischemic disease. Vascular: Normal flow voids. Skull and upper cervical spine: Normal  marrow signal. Sinuses/Orbits: Mucosal thickening in the left greater than right maxillary sinus and bilateral ethmoid air cells. The orbits are unremarkable. Other: Fluid in the left-greater-than-right mastoid air cells. IMPRESSION: No acute intracranial process. Electronically Signed   By: Merilyn Baba M.D.   On: 04/11/2021 22:20   DG Humerus Right  Result Date: 04/12/2021 CLINICAL DATA:  Fall and right upper extremity pain. EXAM: RIGHT HUMERUS - 2+ VIEW COMPARISON:  None. FINDINGS: No acute fracture or dislocation. The bones are well mineralized. No arthritic changes. There is abrasion of the soft tissues of the dorsum of the elbow. No radiopaque foreign object. IMPRESSION: No acute/traumatic osseous pathology. Electronically Signed   By: Anner Crete M.D.   On: 04/12/2021 01:45   ECHOCARDIOGRAM COMPLETE  Result Date: 04/12/2021    ECHOCARDIOGRAM REPORT   Patient Name:   BEVELYN ANN Pinard Date of Exam: 04/12/2021 Medical Rec #:  [id number]       Height:       67.0 in Accession #:    [id number]      Weight:       300.0 lb Date of Birth:  1966-01-14      BSA:          2.403 m Patient Age:    38 years        BP:           109/68 mmHg Patient Gender: F               HR:           76 bpm. Exam Location:  Inpatient Procedure: 2D Echo, Cardiac Doppler and Color Doppler Indications:    Afib  History:        Patient has no prior history of Echocardiogram examinations.  Sonographer:    Glo Herring Referring Phys: [id number] Rise Patience  Sonographer Comments: Suboptimal apical window. IMPRESSIONS  1. Left ventricular ejection fraction, by estimation, is 50 to 55%. The left  ventricle has low normal function. The left ventricle has no regional wall motion abnormalities. Left ventricular diastolic parameters are indeterminate.  2. Right ventricular systolic function is normal. The right ventricular size is normal. Tricuspid regurgitation signal is inadequate for assessing PA pressure.  3. Left atrial size was mildly dilated.  4. The mitral valve is normal in structure. Trivial mitral valve regurgitation. No evidence of mitral stenosis.  5. The aortic valve is grossly normal. Aortic valve regurgitation is not visualized. No aortic stenosis is present. FINDINGS  Left Ventricle: Left ventricular ejection fraction, by estimation, is 50 to 55%. The left ventricle has low normal function. The left ventricle has no regional wall motion abnormalities. The left ventricular internal cavity size was normal in size. There is borderline left ventricular hypertrophy. Left ventricular diastolic parameters are indeterminate. Right Ventricle: The right ventricular size is normal. No increase in right ventricular wall thickness. Right ventricular systolic function is normal. Tricuspid regurgitation signal is inadequate for assessing PA pressure. Left Atrium: Left atrial size was mildly dilated. Right Atrium: Right atrial size was normal in size. Pericardium: There is no evidence of pericardial effusion. Mitral Valve: The mitral valve is normal in structure. Trivial mitral valve regurgitation. No evidence of mitral valve stenosis. Tricuspid Valve: The tricuspid valve is normal in structure. Tricuspid valve regurgitation is trivial. No evidence of tricuspid stenosis. Aortic Valve: The aortic valve is grossly normal. Aortic valve regurgitation is not visualized. No aortic stenosis is present. Aortic valve mean gradient measures 4.7 mmHg. Aortic  valve peak gradient measures 8.2 mmHg. Aortic valve area, by VTI measures 1.58 cm. Pulmonic Valve: The pulmonic valve was not well visualized. Pulmonic valve  regurgitation is not visualized. No evidence of pulmonic stenosis. Aorta: The aortic root is normal in size and structure. Venous: The inferior vena cava was not well visualized. IAS/Shunts: The interatrial septum was not well visualized.  LEFT VENTRICLE PLAX 2D LVIDd:         4.80 cm   Diastology LVIDs:         3.50 cm   LV e' medial:    8.49 cm/s LV PW:         1.00 cm   LV E/e' medial:  13.0 LV IVS:        1.00 cm   LV e' lateral:   13.60 cm/s LVOT diam:     2.00 cm   LV E/e' lateral: 8.1 LV SV:         44 LV SV Index:   18 LVOT Area:     3.14 cm  RIGHT VENTRICLE RV Basal diam:  3.50 cm LEFT ATRIUM             Index        RIGHT ATRIUM           Index LA diam:        4.00 cm 1.66 cm/m   RA Area:     23.80 cm LA Vol (A2C):   72.7 ml 30.26 ml/m  RA Volume:   74.20 ml  30.88 ml/m LA Vol (A4C):   92.4 ml 38.46 ml/m LA Biplane Vol: 90.1 ml 37.50 ml/m  AORTIC VALVE                     PULMONIC VALVE AV Area (Vmax):    1.72 cm      PV Vmax:       0.92 m/s AV Area (Vmean):   1.75 cm      PV Peak grad:  3.4 mmHg AV Area (VTI):     1.58 cm AV Vmax:           143.00 cm/s AV Vmean:          101.367 cm/s AV VTI:            0.278 m AV Peak Grad:      8.2 mmHg AV Mean Grad:      4.7 mmHg LVOT Vmax:         78.33 cm/s LVOT Vmean:        56.467 cm/s LVOT VTI:          0.140 m LVOT/AV VTI ratio: 0.50  AORTA Ao Root diam: 2.60 cm Ao Asc diam:  2.70 cm MITRAL VALVE MV Area (PHT): 3.03 cm     SHUNTS MV Decel Time: 250 msec     Systemic VTI:  0.14 m MV E velocity: 110.00 cm/s  Systemic Diam: 2.00 cm Cherlynn Kaiser MD Electronically signed by Cherlynn Kaiser MD Signature Date/Time: 04/12/2021/2:14:47 PM    Final         Scheduled Meds:  azaTHIOprine  50 mg Oral Daily   lactulose  30 g Oral TID   levothyroxine  150 mcg Oral Q0600   metoprolol tartrate  12.5 mg Oral BID   rifaximin  550 mg Oral BID   Continuous Infusions:   LOS: 0 days   Time spent: 35 mins Greater than 50% of this time was spent in  counseling, explanation of diagnosis, planning of further management, and coordination of care.   Voice Recognition Viviann Spare dictation system was used to create this note, attempts have been made to correct errors. Please contact the author with questions and/or clarifications.   Florencia Reasons, MD PhD FACP [facility]  Available via Epic secure chat 7am-7pm for nonurgent issues Please page for urgent issues To page the attending provider between 7A-7P or the covering provider during after hours 7P-7A, please log into the web site [email or web address] and access using universal [place] password for that web site. If you do not have the password, please call the hospital operator.    04/12/2021, 4:27 PM

## 2021-04-13 DIAGNOSIS — K7682 Hepatic encephalopathy: Secondary | ICD-10-CM | POA: Diagnosis not present

## 2021-04-13 LAB — COMPREHENSIVE METABOLIC PANEL
ALT: 30 U/L (ref 0–44)
AST: 53 U/L — ABNORMAL HIGH (ref 15–41)
Albumin: 2.5 g/dL — ABNORMAL LOW (ref 3.5–5.0)
Alkaline Phosphatase: 62 U/L (ref 38–126)
Anion gap: 4 — ABNORMAL LOW (ref 5–15)
BUN: 10 mg/dL (ref 6–20)
CO2: 22 mmol/L (ref 22–32)
Calcium: 8.1 mg/dL — ABNORMAL LOW (ref 8.9–10.3)
Chloride: 113 mmol/L — ABNORMAL HIGH (ref 98–111)
Creatinine, Ser: 0.89 mg/dL (ref 0.44–1.00)
GFR, Estimated: >60 mL/min (ref >60)
Glucose, Bld: 83 mg/dL (ref 70–99)
Potassium: 3.4 mmol/L — ABNORMAL LOW (ref 3.5–5.1)
Sodium: 139 mmol/L (ref 135–145)
Total Bilirubin: 1.9 mg/dL — ABNORMAL HIGH (ref 0.3–1.2)
Total Protein: 5.7 g/dL — ABNORMAL LOW (ref 6.5–8.1)

## 2021-04-13 LAB — C-REACTIVE PROTEIN
CRP: 0.6 mg/dL (ref <1.0)
CRP: 0.6 mg/dL (ref <1.0)

## 2021-04-13 LAB — CBC WITH DIFFERENTIAL/PLATELET
Abs Immature Granulocytes: 0.01 10*3/uL (ref 0.00–0.07)
Basophils Absolute: 0 10*3/uL (ref 0.0–0.1)
Basophils Relative: 1 %
Eosinophils Absolute: 0.2 10*3/uL (ref 0.0–0.5)
Eosinophils Relative: 9 %
HCT: 35.6 % — ABNORMAL LOW (ref 36.0–46.0)
Hemoglobin: 11.9 g/dL — ABNORMAL LOW (ref 12.0–15.0)
Immature Granulocytes: 0 %
Lymphocytes Relative: 37 %
Lymphs Abs: 1 10*3/uL (ref 0.7–4.0)
MCH: 32.1 pg (ref 26.0–34.0)
MCHC: 33.4 g/dL (ref 30.0–36.0)
MCV: 96 fL (ref 80.0–100.0)
Monocytes Absolute: 0.3 10*3/uL (ref 0.1–1.0)
Monocytes Relative: 10 %
Neutro Abs: 1.2 10*3/uL — ABNORMAL LOW (ref 1.7–7.7)
Neutrophils Relative %: 43 %
Platelets: 109 10*3/uL — ABNORMAL LOW (ref 150–400)
RBC: 3.71 MIL/uL — ABNORMAL LOW (ref 3.87–5.11)
RDW: 13.9 % (ref 11.5–15.5)
WBC: 2.7 10*3/uL — ABNORMAL LOW (ref 4.0–10.5)
nRBC: 0 % (ref 0.0–0.2)

## 2021-04-13 LAB — D-DIMER, QUANTITATIVE: D-Dimer, Quant: 1.04 ug/mL-FEU — ABNORMAL HIGH (ref 0.00–0.50)

## 2021-04-13 LAB — MAGNESIUM: Magnesium: 2 mg/dL (ref 1.7–2.4)

## 2021-04-13 MED ORDER — MIDODRINE HCL 5 MG PO TABS
5.0000 mg | ORAL_TABLET | Freq: Three times a day (TID) | ORAL | Status: DC
  Administered 2021-04-13: 18:00:00 5 mg via ORAL
  Filled 2021-04-13 (×2): qty 1

## 2021-04-13 MED ORDER — MIDODRINE HCL 5 MG PO TABS
5.0000 mg | ORAL_TABLET | Freq: Three times a day (TID) | ORAL | Status: DC
  Administered 2021-04-14 (×2): 5 mg via ORAL
  Filled 2021-04-13 (×3): qty 1

## 2021-04-13 MED ORDER — COSYNTROPIN 0.25 MG IJ SOLR
0.2500 mg | Freq: Once | INTRAMUSCULAR | Status: AC
  Administered 2021-04-14: 08:00:00 0.25 mg via INTRAVENOUS
  Filled 2021-04-13: qty 0.25

## 2021-04-13 MED ORDER — POTASSIUM CHLORIDE 20 MEQ PO PACK
40.0000 meq | PACK | Freq: Once | ORAL | Status: AC
  Administered 2021-04-13: 13:00:00 40 meq via ORAL
  Filled 2021-04-13: qty 2

## 2021-04-13 MED ORDER — MIDODRINE HCL 2.5 MG PO TABS
2.5000 mg | ORAL_TABLET | Freq: Three times a day (TID) | ORAL | Status: DC
  Administered 2021-04-13: 13:00:00 2.5 mg via ORAL
  Filled 2021-04-13 (×2): qty 1

## 2021-04-13 NOTE — Progress Notes (Signed)
The patient is injury-free, afebrile, alert, and oriented X 3. The vital signs were within the baseline during this shift.Pt denies chest pain, SOB, nausea, vomiting, dizziness, signs or symptoms of bleeding or infection or acute changes during this shift. We will continue to monitor and work toward achieving the care plan goals. 

## 2021-04-13 NOTE — Plan of Care (Signed)

## 2021-04-13 NOTE — Progress Notes (Addendum)
PROGRESS NOTE    Connie Larsen  [id number] DOB: [birth year] DOA: 04/11/2021 PCP: System, Provider Not In    No chief complaint on file.   Brief Narrative:   Connie Larsen is a 55 y.o. female with history of cirrhosis of liver and autoimmune hepatitis, hypothyroidism who has recently moved in from [place] to [place] was found this morning to be confused by patient's daughter Found to be in [place] on presentation, elevated ammonia level and tested + for covid  Subjective:  Borderline hypotension this am, does not appear to be symptomatic Aaox3 but still slow and weak, less asterixis, No fever, denies pain, no cough   Assessment & Plan:   Principal Problem:   Hepatic encephalopathy Active Problems:   Cirrhosis of liver (HCC)   Hypothyroidism   Hepatic encephalopathy -CT had MRI brain no acute findings --No abdominal pain, abdomen soft, abdominal ultrasound did not reveal ascites -Continue lactulose and rifaximin -Slowly improving  New onset of afib/presented with A. fib RVR Is started on beta-blocker, heart rate controlled, he is not a candidate for anticoagulation due to fall risk also has thrombocytopenia Her TSH is 3.9 Keep on telemetry, keep K above 4, mag above 2  Hypotension Does not appear symptomatic  Ua no bacteria Cxr no acute findings Does not appear to have ascites  Will add on blood culture Check am cortisol level/stim test Start midodrine Reports was taken off steroids recently, may be relative adrenal insufficiency, plan to start stress Larsen steroids after am stim test if bp remain low, continue midodrine for now  Hypokalemia, replace K, recheck in the morning, mag 2.0  COVID infection -No hypoxia, chest x-ray no acute findings, lung exam clears -Most likely incidental COVID infection due to risk factor plan to treat, case discussed with pharmacy, will avoid remdesivir or Paxil elevated due to underlying liver disease , she is started  on molnupiravir for 5 days Monitor inflammatory markers, O2 sats  History of autoimmune hepatitis,  patient verified that she is on azathioprine 100 mg daily, she report is tapered off steroid.  Mild elevated AST, assume from autoimmune hepatitis Unclear baseline Trend  Lymphopenia and thrombocytopenia could be from cirrhosis, monitor CBC  Hypothyroidism: Continue Synthroid  :History of fall with large bruise on right arm from recent fall, right humerus x-ray no acute findings Patient report she only started fall recently, is wondering is related to her low blood pressure Might have underlying relative adrenal insufficiency after tapering off steroid, awaiting for stim test PT will see   Class III obesity: Body mass index is 46.99 kg/m.Marland Kitchen       Unresulted Labs (From admission, onward)     Start     Ordered   04/14/21 0500  Ammonia  Tomorrow morning,   R       Question:  Specimen collection method  Answer:  Lab=Lab collect   04/13/21 1103   04/13/21 0500  CBC with Differential/Platelet  Daily,   R      04/12/21 0647   04/13/21 0500  Comprehensive metabolic panel  Daily,   R      04/12/21 0647   04/13/21 0500  C-reactive protein  Daily,   R      04/12/21 0647   04/13/21 0500  D-dimer, quantitative  Daily,   R      04/12/21 0647   04/11/21 2215  HIV Antibody (routine testing w rflx)  (HIV Antibody (Routine testing w reflex) panel)  Once,   R  04/11/21 2216              DVT prophylaxis: SCDs Start: 04/11/21 2215   Code Status: Full Family Communication: Daughter and son over the phone on 11/26 Disposition:   Status is: Inpatient  Dispo: The patient is from: Home              Anticipated d/c is to: Home with home health              Anticipated d/c date is: Possibly midweek               Consultants:  None  Procedures:  None  Antimicrobials:   Anti-infectives (From admission, onward)    Start     [place]/Rate Route Frequency Ordered Stop    04/12/21 2200  rifaximin (XIFAXAN) tablet 550 mg        550 mg Oral 2 times daily 04/12/21 1337     04/12/21 2200  nirmatrelvir/ritonavir EUA (PAXLOVID) 3 tablet  Status:  Discontinued        3 tablet Oral 2 times daily 04/12/21 1851 04/12/21 1859   04/12/21 2200  molnupiravir EUA (LAGEVRIO) capsule 800 mg        4 capsule Oral 2 times daily 04/12/21 1859 04/17/21 2159   04/11/21 2300  rifaximin (XIFAXAN) tablet 200 mg  Status:  Discontinued        200 mg Oral 2 times daily 04/11/21 2219 04/12/21 1337           Objective: Vitals:   04/12/21 1933 04/13/21 0609 04/13/21 1053 04/13/21 1431  BP: 125/83 (!) 107/58 (!) 82/54 (!) 88/63  Pulse: 91 83 72 95  Resp: 16 20 20 20   Temp: 98 F (36.7 C) 98 F (36.7 C) 98.7 F (37.1 C) 98.5 F (36.9 C)  TempSrc: Oral Oral Oral Oral  SpO2: 100% 98% 96% 94%  Weight:      Height:       No intake or output data in the 24 hours ending 04/13/21 1453 Filed Weights   04/11/21 1728  Weight: 136.1 kg    Examination:  General exam: Slightly improved , AAOx3 , but continued to have slow speech , lethargy , less asterixis Respiratory system: Clear to auscultation. Respiratory effort normal. Cardiovascular system:  RRR.  Gastrointestinal system: Protuberant abdomen , but soft , nontender .  Normal bowel sounds heard. Central nervous system: Lethargic, AAOx3 Extremities:  no edema Skin: Ecchymosis on arm Psychiatry: Lethargic, cooperative, no agitation    Data Reviewed: I have personally reviewed following labs and imaging studies  CBC: Recent Labs  Lab 04/11/21 1741 04/12/21 0630 04/13/21 0526  WBC 2.3* 2.8* 2.7*  NEUTROABS 1.3* 1.3* 1.2*  HGB 13.3 12.9 11.9*  HCT 39.1 37.7 35.6*  MCV 95.6 95.9 96.0  PLT 108* 112* 109*    Basic Metabolic Panel: Recent Labs  Lab 04/11/21 1747 04/12/21 0630 04/13/21 0526  NA 139 139 139  K 3.2* 3.4* 3.4*  CL 109 110 113*  CO2 24 22 22   GLUCOSE 105* 91 83  BUN 10 8 10   CREATININE 1.02*  0.77 0.89  CALCIUM 8.1* 8.0* 8.1*  MG 2.1  --  2.0    GFR: Estimated Creatinine Clearance: 104.3 mL/min (by C-G formula based on SCr of 0.89 mg/dL).  Liver Function Tests: Recent Labs  Lab 04/11/21 1747 04/12/21 0630 04/13/21 0526  AST 66* 59* 53*  ALT 36 33 30  ALKPHOS 76 68 62  BILITOT 1.8* 2.0* 1.9*  PROT 6.9 6.2* 5.7*  ALBUMIN 3.0* 2.8* 2.5*    CBG: No results for input(s): GLUCAP in the last 168 hours.   Recent Results (from the past 240 hour(s))  Resp Panel by RT-PCR (Flu A&B, Covid) Urine, Clean Catch     Status: Abnormal   Collection Time: 04/11/21  8:19 PM   Specimen: Urine, Clean Catch; Nasopharyngeal(NP) swabs in vial transport medium  Result Value Ref Range Status   SARS Coronavirus 2 by RT PCR POSITIVE (A) NEGATIVE Final    Comment: RESULT CALLED TO, READ BACK BY AND VERIFIED WITH: ELLWANGER,A. 04/11/21 @22 :52 BY SEEL,M. (NOTE) SARS-CoV-2 target nucleic acids are DETECTED.  The SARS-CoV-2 RNA is generally detectable in upper respiratory specimens during the acute phase of infection. Positive results are indicative of the presence of the identified virus, but do not rule out bacterial infection or co-infection with other pathogens not detected by the test. Clinical correlation with patient history and other diagnostic information is necessary to determine patient infection status. The expected result is Negative.  Fact Sheet for Patients: EntrepreneurPulse.com.au  Fact Sheet for Healthcare Providers: IncredibleEmployment.be  This test is not yet approved or cleared by the [place] FDA and  has been authorized for detection and/or diagnosis of SARS-CoV-2 by FDA under an Emergency Use Authorization (EUA).  This EUA will remain in effect (meaning this test  can be used) for the duration of  the COVID-19 declaration under Section 564(b)(1) of the Act, 21 U.S.C. section 360bbb-3(b)(1), unless the authorization  is terminated or revoked sooner.     Influenza A by PCR NEGATIVE NEGATIVE Final   Influenza B by PCR NEGATIVE NEGATIVE Final    Comment: (NOTE) The Xpert Xpress SARS-CoV-2/FLU/RSV plus assay is intended as an aid in the diagnosis of influenza from Nasopharyngeal swab specimens and should not be used as a sole basis for treatment. Nasal washings and aspirates are unacceptable for Xpert Xpress SARS-CoV-2/FLU/RSV testing.  Fact Sheet for Patients: EntrepreneurPulse.com.au  Fact Sheet for Healthcare Providers: IncredibleEmployment.be  This test is not yet approved or cleared by the [place] FDA and has been authorized for detection and/or diagnosis of SARS-CoV-2 by FDA under an Emergency Use Authorization (EUA). This EUA will remain in effect (meaning this test can be used) for the duration of the COVID-19 declaration under Section 564(b)(1) of the Act, 21 U.S.C. section 360bbb-3(b)(1), unless the authorization is terminated or revoked.  Performed at [facility], [place] [address], [place], [place] [address]          Radiology Studies: DG Chest 2 View  Result Date: 04/11/2021 CLINICAL DATA:  Altered mental status EXAM: CHEST - 2 VIEW COMPARISON:  None. FINDINGS: Borderline cardiomegaly. No focal opacity, pleural effusion or pneumothorax. IMPRESSION: No active cardiopulmonary disease. Electronically Signed   By: Donavan Foil M.D.   On: 04/11/2021 19:10   CT HEAD WO CONTRAST (5MM)  Result Date: 04/11/2021 CLINICAL DATA:  Delirium EXAM: CT HEAD WITHOUT CONTRAST TECHNIQUE: Contiguous axial images were obtained from the base of the skull through the vertex without intravenous contrast. COMPARISON:  None. FINDINGS: Brain: No evidence of acute infarction, hemorrhage, hydrocephalus, extra-axial collection or mass lesion/mass effect. Vascular: No hyperdense vessel or unexpected calcification. Skull: Normal. Negative for  fracture or focal lesion. Sinuses/Orbits: No acute finding. Lobulated mucosal thickening in the left maxillary sinus. Other: None IMPRESSION: Negative non contrasted CT appearance of the brain Electronically Signed   By: Donavan Foil M.D.   On: 04/11/2021 19:09  MR BRAIN WO CONTRAST  Result Date: 04/11/2021 CLINICAL DATA:  Delirium, stroke suspected EXAM: MRI HEAD WITHOUT CONTRAST TECHNIQUE: Multiplanar, multiecho pulse sequences of the brain and surrounding structures were obtained without intravenous contrast. COMPARISON:  No prior MRI, correlation is made with CT head 04/11/2021. FINDINGS: Brain: No restricted diffusion to suggest acute or subacute infarct. No acute hemorrhage mass mass effect, or midline shift. No extra-axial collection or hydrocephalus. No foci of hemosiderin deposition to suggest remote hemorrhage. Minimal T2 hyperintense signal in the periventricular white matter, likely the sequela of chronic small vessel ischemic disease. Vascular: Normal flow voids. Skull and upper cervical spine: Normal marrow signal. Sinuses/Orbits: Mucosal thickening in the left greater than right maxillary sinus and bilateral ethmoid air cells. The orbits are unremarkable. Other: Fluid in the left-greater-than-right mastoid air cells. IMPRESSION: No acute intracranial process. Electronically Signed   By: Merilyn Baba M.D.   On: 04/11/2021 22:20   US Abdomen Limited  Result Date: 04/12/2021 CLINICAL DATA:  Cirrhosis. EXAM: ULTRASOUND ABDOMEN LIMITED RIGHT UPPER QUADRANT COMPARISON:  None. FINDINGS: Suboptimal evaluation due to overlying bowel gas and patient body habitus. Gallbladder: Not visualized due to overlying bowel gas. Common bile duct: Diameter: 0.2 cm Liver: Suboptimal visualization. No focal lesion identified in the visualized parenchyma. Within normal limits in parenchymal echogenicity. Portal vein is patent on color Doppler imaging with normal direction of blood flow towards the liver. Other:  None. IMPRESSION: Suboptimal evaluation due to patient body habitus and overlying bowel gas. Gallbladder is not visualized. No focal abnormality in the visualized portions of the liver. Electronically Signed   By: Ileana Roup M.D.   On: 04/12/2021 17:49   DG Humerus Right  Result Date: 04/12/2021 CLINICAL DATA:  Fall and right upper extremity pain. EXAM: RIGHT HUMERUS - 2+ VIEW COMPARISON:  None. FINDINGS: No acute fracture or dislocation. The bones are well mineralized. No arthritic changes. There is abrasion of the soft tissues of the dorsum of the elbow. No radiopaque foreign object. IMPRESSION: No acute/traumatic osseous pathology. Electronically Signed   By: Anner Crete M.D.   On: 04/12/2021 01:45   ECHOCARDIOGRAM COMPLETE  Result Date: 04/12/2021    ECHOCARDIOGRAM REPORT   Patient Name:   MCCARTNEY ANN Wesche Date of Exam: 04/12/2021 Medical Rec #:  [id number]       Height:       67.0 in Accession #:    [id number]      Weight:       300.0 lb Date of Birth:  1966/02/17      BSA:          2.403 m Patient Age:    32 years        BP:           109/68 mmHg Patient Gender: F               HR:           76 bpm. Exam Location:  Inpatient Procedure: 2D Echo, Cardiac Doppler and Color Doppler Indications:    Afib  History:        Patient has no prior history of Echocardiogram examinations.  Sonographer:    Glo Herring Referring Phys: [id number] Rise Patience  Sonographer Comments: Suboptimal apical window. IMPRESSIONS  1. Left ventricular ejection fraction, by estimation, is 50 to 55%. The left ventricle has low normal function. The left ventricle has no regional wall motion abnormalities. Left ventricular diastolic parameters are indeterminate.  2. Right ventricular systolic function  is normal. The right ventricular size is normal. Tricuspid regurgitation signal is inadequate for assessing PA pressure.  3. Left atrial size was mildly dilated.  4. The mitral valve is normal in structure. Trivial mitral  valve regurgitation. No evidence of mitral stenosis.  5. The aortic valve is grossly normal. Aortic valve regurgitation is not visualized. No aortic stenosis is present. FINDINGS  Left Ventricle: Left ventricular ejection fraction, by estimation, is 50 to 55%. The left ventricle has low normal function. The left ventricle has no regional wall motion abnormalities. The left ventricular internal cavity size was normal in size. There is borderline left ventricular hypertrophy. Left ventricular diastolic parameters are indeterminate. Right Ventricle: The right ventricular size is normal. No increase in right ventricular wall thickness. Right ventricular systolic function is normal. Tricuspid regurgitation signal is inadequate for assessing PA pressure. Left Atrium: Left atrial size was mildly dilated. Right Atrium: Right atrial size was normal in size. Pericardium: There is no evidence of pericardial effusion. Mitral Valve: The mitral valve is normal in structure. Trivial mitral valve regurgitation. No evidence of mitral valve stenosis. Tricuspid Valve: The tricuspid valve is normal in structure. Tricuspid valve regurgitation is trivial. No evidence of tricuspid stenosis. Aortic Valve: The aortic valve is grossly normal. Aortic valve regurgitation is not visualized. No aortic stenosis is present. Aortic valve mean gradient measures 4.7 mmHg. Aortic valve peak gradient measures 8.2 mmHg. Aortic valve area, by VTI measures 1.58 cm. Pulmonic Valve: The pulmonic valve was not well visualized. Pulmonic valve regurgitation is not visualized. No evidence of pulmonic stenosis. Aorta: The aortic root is normal in size and structure. Venous: The inferior vena cava was not well visualized. IAS/Shunts: The interatrial septum was not well visualized.  LEFT VENTRICLE PLAX 2D LVIDd:         4.80 cm   Diastology LVIDs:         3.50 cm   LV e' medial:    8.49 cm/s LV PW:         1.00 cm   LV E/e' medial:  13.0 LV IVS:        1.00 cm    LV e' lateral:   13.60 cm/s LVOT diam:     2.00 cm   LV E/e' lateral: 8.1 LV SV:         44 LV SV Index:   18 LVOT Area:     3.14 cm  RIGHT VENTRICLE RV Basal diam:  3.50 cm LEFT ATRIUM             Index        RIGHT ATRIUM           Index LA diam:        4.00 cm 1.66 cm/m   RA Area:     23.80 cm LA Vol (A2C):   72.7 ml 30.26 ml/m  RA Volume:   74.20 ml  30.88 ml/m LA Vol (A4C):   92.4 ml 38.46 ml/m LA Biplane Vol: 90.1 ml 37.50 ml/m  AORTIC VALVE                     PULMONIC VALVE AV Area (Vmax):    1.72 cm      PV Vmax:       0.92 m/s AV Area (Vmean):   1.75 cm      PV Peak grad:  3.4 mmHg AV Area (VTI):     1.58 cm AV Vmax:  143.00 cm/s AV Vmean:          101.367 cm/s AV VTI:            0.278 m AV Peak Grad:      8.2 mmHg AV Mean Grad:      4.7 mmHg LVOT Vmax:         78.33 cm/s LVOT Vmean:        56.467 cm/s LVOT VTI:          0.140 m LVOT/AV VTI ratio: 0.50  AORTA Ao Root diam: 2.60 cm Ao Asc diam:  2.70 cm MITRAL VALVE MV Area (PHT): 3.03 cm     SHUNTS MV Decel Time: 250 msec     Systemic VTI:  0.14 m MV E velocity: 110.00 cm/s  Systemic Diam: 2.00 cm Cherlynn Kaiser MD Electronically signed by Cherlynn Kaiser MD Signature Date/Time: 04/12/2021/2:14:47 PM    Final         Scheduled Meds:  azaTHIOprine  100 mg Oral Daily   lactulose  30 g Oral TID   levothyroxine  150 mcg Oral Q0600   metoprolol tartrate  12.5 mg Oral BID   midodrine  5 mg Oral TID WC   molnupiravir EUA  4 capsule Oral BID   rifaximin  550 mg Oral BID   Continuous Infusions:   LOS: 1 day   Time spent: 35 mins Greater than 50% of this time was spent in counseling, explanation of diagnosis, planning of further management, and coordination of care.   Voice Recognition Viviann Spare dictation system was used to create this note, attempts have been made to correct errors. Please contact the author with questions and/or clarifications.   Florencia Reasons, MD PhD FACP [facility]  Available via Epic secure chat  7am-7pm for nonurgent issues Please page for urgent issues To page the attending provider between 7A-7P or the covering provider during after hours 7P-7A, please log into the web site [email or web address] and access using universal [place] password for that web site. If you do not have the password, please call the hospital operator.    04/13/2021, 2:53 PM

## 2021-04-14 ENCOUNTER — Encounter (HOSPITAL_COMMUNITY): Payer: Self-pay | Admitting: Internal Medicine

## 2021-04-14 DIAGNOSIS — K7682 Hepatic encephalopathy: Secondary | ICD-10-CM | POA: Diagnosis not present

## 2021-04-14 DIAGNOSIS — I480 Paroxysmal atrial fibrillation: Secondary | ICD-10-CM | POA: Diagnosis not present

## 2021-04-14 DIAGNOSIS — K7460 Unspecified cirrhosis of liver: Secondary | ICD-10-CM | POA: Diagnosis not present

## 2021-04-14 LAB — COMPREHENSIVE METABOLIC PANEL
ALT: 32 U/L (ref 0–44)
AST: 52 U/L — ABNORMAL HIGH (ref 15–41)
Albumin: 2.6 g/dL — ABNORMAL LOW (ref 3.5–5.0)
Alkaline Phosphatase: 67 U/L (ref 38–126)
Anion gap: 6 (ref 5–15)
BUN: 11 mg/dL (ref 6–20)
CO2: 22 mmol/L (ref 22–32)
Calcium: 8.2 mg/dL — ABNORMAL LOW (ref 8.9–10.3)
Chloride: 109 mmol/L (ref 98–111)
Creatinine, Ser: 0.9 mg/dL (ref 0.44–1.00)
GFR, Estimated: >60 mL/min (ref >60)
Glucose, Bld: 100 mg/dL — ABNORMAL HIGH (ref 70–99)
Potassium: 3.8 mmol/L (ref 3.5–5.1)
Sodium: 137 mmol/L (ref 135–145)
Total Bilirubin: 2.2 mg/dL — ABNORMAL HIGH (ref 0.3–1.2)
Total Protein: 6 g/dL — ABNORMAL LOW (ref 6.5–8.1)

## 2021-04-14 LAB — CBC WITH DIFFERENTIAL/PLATELET
Abs Immature Granulocytes: 0.01 10*3/uL (ref 0.00–0.07)
Basophils Absolute: 0 10*3/uL (ref 0.0–0.1)
Basophils Relative: 1 %
Eosinophils Absolute: 0.2 10*3/uL (ref 0.0–0.5)
Eosinophils Relative: 6 %
HCT: 36.6 % (ref 36.0–46.0)
Hemoglobin: 12.3 g/dL (ref 12.0–15.0)
Immature Granulocytes: 0 %
Lymphocytes Relative: 33 %
Lymphs Abs: 0.8 10*3/uL (ref 0.7–4.0)
MCH: 32.3 pg (ref 26.0–34.0)
MCHC: 33.6 g/dL (ref 30.0–36.0)
MCV: 96.1 fL (ref 80.0–100.0)
Monocytes Absolute: 0.2 10*3/uL (ref 0.1–1.0)
Monocytes Relative: 8 %
Neutro Abs: 1.3 10*3/uL — ABNORMAL LOW (ref 1.7–7.7)
Neutrophils Relative %: 52 %
Platelets: 118 10*3/uL — ABNORMAL LOW (ref 150–400)
RBC: 3.81 MIL/uL — ABNORMAL LOW (ref 3.87–5.11)
RDW: 13.5 % (ref 11.5–15.5)
WBC: 2.6 10*3/uL — ABNORMAL LOW (ref 4.0–10.5)
nRBC: 0 % (ref 0.0–0.2)

## 2021-04-14 LAB — C-REACTIVE PROTEIN: CRP: 0.6 mg/dL (ref <1.0)

## 2021-04-14 LAB — ACTH STIMULATION, 3 TIME POINTS
Cortisol, 30 Min: 15.6 ug/dL
Cortisol, 60 Min: 19.2 ug/dL
Cortisol, Base: 6.3 ug/dL

## 2021-04-14 LAB — AMMONIA: Ammonia: 55 umol/L — ABNORMAL HIGH (ref 9–35)

## 2021-04-14 LAB — CORTISOL: Cortisol, Plasma: 5.8 ug/dL

## 2021-04-14 LAB — D-DIMER, QUANTITATIVE: D-Dimer, Quant: 1.15 ug/mL-FEU — ABNORMAL HIGH (ref 0.00–0.50)

## 2021-04-14 MED ORDER — MIDODRINE HCL 5 MG PO TABS
10.0000 mg | ORAL_TABLET | Freq: Three times a day (TID) | ORAL | Status: DC
  Administered 2021-04-14 – 2021-04-15 (×2): 10 mg via ORAL
  Filled 2021-04-14 (×3): qty 2

## 2021-04-14 NOTE — Consult Note (Signed)
Cardiology Consultation:   Patient ID: Connie Larsen MRN: [id number]; DOB: Jul 11, 1965  Admit date: 04/11/2021 Date of Consult: 04/14/2021  PCP:  System, Provider Not In   [facility] Providers Cardiologist:  Buford Dresser, MD  new  Patient Profile:   Connie Larsen is a 55 y.o. female with a hx of autoimmune hepatitis and cirrhosis, hypothyroidism, OA, PTSD, previous resident of [place], who is being seen 04/14/2021 for the evaluation of atrial fib RVR in the setting of COVID at the request of Dr. Tawanna Solo.  History of Present Illness:   Ms. Connie Larsen was admitted 11/25 with slurred speech and confusion.  She had not taken her medications for the last 3 days. She moved here from [place] in March.  She was in atrial fibrillation, new onset.  Her ammonia level was 144, platelets 108 and WBC is 2.3.    She was started on lactulose and admitted for acute hepatic encephalopathy.  Her potassium was slightly low at 3.4 and was supplemented.  Her calcium was also low at 8.0 and is being followed.  Albumin was 2.5.  AST was elevated at 59 but ALT was normal.  She was positive for COVID.  She was started on a beta-blocker and her heart rate was controlled.  Cardiology was consulted.  She was out of the liver meds, has been trying to get a liver MD here, but has not heard back. She ran out.   She does not know how she got COVID. She did go shopping at [facility], but generally has not been leaving the house.  She does not know when the Afib started. She has noticed some DOE, but it has been going on a couple of months. She used to take her BP daily, but the cuff was lost in the move. She has not gotten another one.   Her weight is up and down, she feels she has lost wt here. She weighed 279 before she moved. She is aware that she has gained some weight. She wants to start walking again, to lose weight and build stamina.    Past Medical History:  Diagnosis Date   Arthritis     Depression    Liver disease    PTSD (post-traumatic stress disorder)    Thyroid disease     Past Surgical History:  Procedure Laterality Date   ABDOMINAL HYSTERECTOMY       Home Medications:  Prior to Admission medications   Medication Sig Start Date End Date Taking? Authorizing Provider  azathioprine (IMURAN) 100 MG tablet Take 100 mg by mouth 2 (two) times daily.   Yes [provider]  escitalopram (LEXAPRO) 20 MG tablet Take 20 mg by mouth daily. 10/19/20  Yes [provider]  ferrous sulfate 324 MG TBEC Take 324 mg by mouth daily with breakfast.   Yes [provider]  ibuprofen (ADVIL) 200 MG tablet Take 400 mg by mouth every 6 (six) hours as needed for headache.   Yes [provider]  lactulose (CHRONULAC) 10 GM/15ML solution Take 10 g by mouth daily as needed for mild constipation.   Yes [provider]  levothyroxine (SYNTHROID) 125 MCG tablet Take 125 mcg by mouth daily before breakfast.   Yes [provider]  levothyroxine (SYNTHROID) 150 MCG tablet Take 150 mcg by mouth daily before breakfast.   Yes [provider]  rifaximin (XIFAXAN) 550 MG TABS tablet Take 550 mg by mouth 2 (two) times daily.   Yes [provider]  traZODone (DESYREL) 50 MG tablet Take 50 mg by mouth at bedtime as needed for sleep.   Yes [provider]    Inpatient Medications: Scheduled Meds:  azaTHIOprine  100 mg Oral Daily   lactulose  30 g Oral TID   levothyroxine  150 mcg Oral Q0600   midodrine  10 mg Oral TID WC   molnupiravir EUA  4 capsule Oral BID   rifaximin  550 mg Oral BID   Continuous Infusions:  PRN Meds:   Allergies:    Allergies  Allergen Reactions   Tramadol Hcl Anaphylaxis   Aspirin Hives   Morphine And Related Nausea And Vomiting   Tylenol [Acetaminophen] Other (See Comments)    Was told not to take due to liver    Social History:   Social History   Socioeconomic History   Marital  status: Divorced    Spouse name: Not on file   Number of children: Not on file   Years of education: Not on file   Highest education level: Not on file  Occupational History   Not on file  Tobacco Use   Smoking status: Never   Smokeless tobacco: Never  Vaping Use   Vaping Use: Never used  Substance and Sexual Activity   Alcohol use: Never   Drug use: Never   Sexual activity: Not on file  Other Topics Concern   Not on file  Social History Narrative   Not on file   Social Determinants of Health   Financial Resource Strain: Not on file  Food Insecurity: Not on file  Transportation Needs: Not on file  Physical Activity: Not on file  Stress: Not on file  Social Connections: Not on file  Intimate Partner Violence: Not on file    Family History:    Family History  Problem Relation Age of Onset   Heart disease Mother    Arrhythmia Father        pacemaker   Heart disease Sister    Arrhythmia Brother        pacemaker     ROS:  Please see the history of present illness.  All other ROS reviewed and negative.     Physical Exam/Data:   Vitals:   04/13/21 2127 04/14/21 0656 04/14/21 0848 04/14/21 1456  BP: (!) 118/55 (!) 111/56 112/65 (!) 91/55  Pulse: 86 80 76 90  Resp: 15 20  20   Temp: 98 F (36.7 C) 98 F (36.7 C)  98.4 F (36.9 C)  TempSrc: Oral Oral  Oral  SpO2: 98% 96%  97%  Weight:      Height:       No intake or output data in the 24 hours ending 04/14/21 1630 Last 3 Weights 04/11/2021  Weight (lbs) 300 lb  Weight (kg) 136.079 kg     Body mass index is 46.99 kg/m.  General:  Well nourished, well developed, in no acute distress HEENT: normal Neck: Mild JVD Vascular: No carotid bruits; Distal pulses 2+ bilaterally Cardiac:  normal S1, S2; Irreg R&R; no murmur  Lungs:  clear to auscultation bilaterally, no wheezing, rhonchi or rales  Abd: soft, nontender, no hepatomegaly  Ext: no edema Musculoskeletal:  No deformities, BUE and BLE strength normal  and equal Skin: warm and dry  Neuro:  CNs 2-12 intact, no focal abnormalities noted Psych:  Normal affect   EKG:  The EKG was personally reviewed and demonstrates: 11/25 ECG is atrial fib with a PVC, heart rate 123 Telemetry:  Telemetry  was personally reviewed and demonstrates:  atrial fib, rate generally < 100  Relevant CV Studies:  ECHO: 04/12/2021  1. Left ventricular ejection fraction, by estimation, is 50 to 55%. The  left ventricle has low normal function. The left ventricle has no regional wall motion abnormalities. Left ventricular diastolic parameters are indeterminate.   2. Right ventricular systolic function is normal. The right ventricular  size is normal. Tricuspid regurgitation signal is inadequate for assessing PA pressure.   3. Left atrial size was mildly dilated.   4. The mitral valve is normal in structure. Trivial mitral valve  regurgitation. No evidence of mitral stenosis.   5. The aortic valve is grossly normal. Aortic valve regurgitation is not  visualized. No aortic stenosis is present.   Laboratory Data:  High Sensitivity Troponin:  No results for input(s): TROPONINIHS in the last 720 hours.   Chemistry Recent Labs  Lab 04/11/21 1747 04/12/21 0630 04/13/21 0526 04/14/21 0828  NA 139 139 139 137  K 3.2* 3.4* 3.4* 3.8  CL 109 110 113* 109  CO2 24 22 22 22   GLUCOSE 105* 91 83 100*  BUN 10 8 10 11   CREATININE 1.02* 0.77 0.89 0.90  CALCIUM 8.1* 8.0* 8.1* 8.2*  MG 2.1  --  2.0  --   GFRNONAA >60 >60 >60 >60  ANIONGAP 6 7 4* 6    Recent Labs  Lab 04/12/21 0630 04/13/21 0526 04/14/21 0828  PROT 6.2* 5.7* 6.0*  ALBUMIN 2.8* 2.5* 2.6*  AST 59* 53* 52*  ALT 33 30 32  ALKPHOS 68 62 67  BILITOT 2.0* 1.9* 2.2*   Lipids No results for input(s): CHOL, TRIG, HDL, LABVLDL, LDLCALC, CHOLHDL in the last 168 hours.  Hematology Recent Labs  Lab 04/12/21 0630 04/13/21 0526 04/14/21 0828  WBC 2.8* 2.7* 2.6*  RBC 3.93 3.71* 3.81*  HGB 12.9 11.9* 12.3   HCT 37.7 35.6* 36.6  MCV 95.9 96.0 96.1  MCH 32.8 32.1 32.3  MCHC 34.2 33.4 33.6  RDW 13.8 13.9 13.5  PLT 112* 109* 118*   Thyroid  Recent Labs  Lab 04/11/21 1741  TSH 3.974  FREET4 0.86    BNPNo results for input(s): BNP, PROBNP in the last 168 hours.  DDimer  Recent Labs  Lab 04/12/21 0840 04/13/21 0526 04/14/21 0828  DDIMER 0.99* 1.04* 1.15*     Radiology/Studies:  DG Chest 2 View  Result Date: 04/11/2021 CLINICAL DATA:  Altered mental status EXAM: CHEST - 2 VIEW COMPARISON:  None. FINDINGS: Borderline cardiomegaly. No focal opacity, pleural effusion or pneumothorax. IMPRESSION: No active cardiopulmonary disease. Electronically Signed   By: Donavan Foil M.D.   On: 04/11/2021 19:10   CT HEAD WO CONTRAST (5MM)  Result Date: 04/11/2021 CLINICAL DATA:  Delirium EXAM: CT HEAD WITHOUT CONTRAST TECHNIQUE: Contiguous axial images were obtained from the base of the skull through the vertex without intravenous contrast. COMPARISON:  None. FINDINGS: Brain: No evidence of acute infarction, hemorrhage, hydrocephalus, extra-axial collection or mass lesion/mass effect. Vascular: No hyperdense vessel or unexpected calcification. Skull: Normal. Negative for fracture or focal lesion. Sinuses/Orbits: No acute finding. Lobulated mucosal thickening in the left maxillary sinus. Other: None IMPRESSION: Negative non contrasted CT appearance of the brain Electronically Signed   By: Donavan Foil M.D.   On: 04/11/2021 19:09   MR BRAIN WO CONTRAST  Result Date: 04/11/2021 CLINICAL DATA:  Delirium, stroke suspected EXAM: MRI HEAD WITHOUT CONTRAST TECHNIQUE: Multiplanar, multiecho pulse sequences of the brain and surrounding structures were obtained without  intravenous contrast. COMPARISON:  No prior MRI, correlation is made with CT head 04/11/2021. FINDINGS: Brain: No restricted diffusion to suggest acute or subacute infarct. No acute hemorrhage mass mass effect, or midline shift. No extra-axial  collection or hydrocephalus. No foci of hemosiderin deposition to suggest remote hemorrhage. Minimal T2 hyperintense signal in the periventricular white matter, likely the sequela of chronic small vessel ischemic disease. Vascular: Normal flow voids. Skull and upper cervical spine: Normal marrow signal. Sinuses/Orbits: Mucosal thickening in the left greater than right maxillary sinus and bilateral ethmoid air cells. The orbits are unremarkable. Other: Fluid in the left-greater-than-right mastoid air cells. IMPRESSION: No acute intracranial process. Electronically Signed   By: Wiliam Ke M.D.   On: 04/11/2021 22:20   US Abdomen Limited  Result Date: 04/12/2021 CLINICAL DATA:  Cirrhosis. EXAM: ULTRASOUND ABDOMEN LIMITED RIGHT UPPER QUADRANT COMPARISON:  None. FINDINGS: Suboptimal evaluation due to overlying bowel gas and patient body habitus. Gallbladder: Not visualized due to overlying bowel gas. Common bile duct: Diameter: 0.2 cm Liver: Suboptimal visualization. No focal lesion identified in the visualized parenchyma. Within normal limits in parenchymal echogenicity. Portal vein is patent on color Doppler imaging with normal direction of blood flow towards the liver. Other: None. IMPRESSION: Suboptimal evaluation due to patient body habitus and overlying bowel gas. Gallbladder is not visualized. No focal abnormality in the visualized portions of the liver. Electronically Signed   By: Sherron Ales M.D.   On: 04/12/2021 17:49   DG Humerus Right  Result Date: 04/12/2021 CLINICAL DATA:  Fall and right upper extremity pain. EXAM: RIGHT HUMERUS - 2+ VIEW COMPARISON:  None. FINDINGS: No acute fracture or dislocation. The bones are well mineralized. No arthritic changes. There is abrasion of the soft tissues of the dorsum of the elbow. No radiopaque foreign object. IMPRESSION: No acute/traumatic osseous pathology. Electronically Signed   By: Elgie Collard M.D.   On: 04/12/2021 01:45   ECHOCARDIOGRAM  COMPLETE  Result Date: 04/12/2021    ECHOCARDIOGRAM REPORT   Patient Name:   KELISSA ANN Mcnelly Date of Exam: 04/12/2021 Medical Rec #:  [id number]       Height:       67.0 in Accession #:    [id number]      Weight:       300.0 lb Date of Birth:  1966-03-13      BSA:          2.403 m Patient Age:    55 years        BP:           109/68 mmHg Patient Gender: F               HR:           76 bpm. Exam Location:  Inpatient Procedure: 2D Echo, Cardiac Doppler and Color Doppler Indications:    Afib  History:        Patient has no prior history of Echocardiogram examinations.  Sonographer:    Vanetta Shawl Referring Phys: 1937 Eduard Clos  Sonographer Comments: Suboptimal apical window. IMPRESSIONS  1. Left ventricular ejection fraction, by estimation, is 50 to 55%. The left ventricle has low normal function. The left ventricle has no regional wall motion abnormalities. Left ventricular diastolic parameters are indeterminate.  2. Right ventricular systolic function is normal. The right ventricular size is normal. Tricuspid regurgitation signal is inadequate for assessing PA pressure.  3. Left atrial size was mildly dilated.  4. The mitral valve is normal  in structure. Trivial mitral valve regurgitation. No evidence of mitral stenosis.  5. The aortic valve is grossly normal. Aortic valve regurgitation is not visualized. No aortic stenosis is present. FINDINGS  Left Ventricle: Left ventricular ejection fraction, by estimation, is 50 to 55%. The left ventricle has low normal function. The left ventricle has no regional wall motion abnormalities. The left ventricular internal cavity size was normal in size. There is borderline left ventricular hypertrophy. Left ventricular diastolic parameters are indeterminate. Right Ventricle: The right ventricular size is normal. No increase in right ventricular wall thickness. Right ventricular systolic function is normal. Tricuspid regurgitation signal is inadequate for  assessing PA pressure. Left Atrium: Left atrial size was mildly dilated. Right Atrium: Right atrial size was normal in size. Pericardium: There is no evidence of pericardial effusion. Mitral Valve: The mitral valve is normal in structure. Trivial mitral valve regurgitation. No evidence of mitral valve stenosis. Tricuspid Valve: The tricuspid valve is normal in structure. Tricuspid valve regurgitation is trivial. No evidence of tricuspid stenosis. Aortic Valve: The aortic valve is grossly normal. Aortic valve regurgitation is not visualized. No aortic stenosis is present. Aortic valve mean gradient measures 4.7 mmHg. Aortic valve peak gradient measures 8.2 mmHg. Aortic valve area, by VTI measures 1.58 cm. Pulmonic Valve: The pulmonic valve was not well visualized. Pulmonic valve regurgitation is not visualized. No evidence of pulmonic stenosis. Aorta: The aortic root is normal in size and structure. Venous: The inferior vena cava was not well visualized. IAS/Shunts: The interatrial septum was not well visualized.  LEFT VENTRICLE PLAX 2D LVIDd:         4.80 cm   Diastology LVIDs:         3.50 cm   LV e' medial:    8.49 cm/s LV PW:         1.00 cm   LV E/e' medial:  13.0 LV IVS:        1.00 cm   LV e' lateral:   13.60 cm/s LVOT diam:     2.00 cm   LV E/e' lateral: 8.1 LV SV:         44 LV SV Index:   18 LVOT Area:     3.14 cm  RIGHT VENTRICLE RV Basal diam:  3.50 cm LEFT ATRIUM             Index        RIGHT ATRIUM           Index LA diam:        4.00 cm 1.66 cm/m   RA Area:     23.80 cm LA Vol (A2C):   72.7 ml 30.26 ml/m  RA Volume:   74.20 ml  30.88 ml/m LA Vol (A4C):   92.4 ml 38.46 ml/m LA Biplane Vol: 90.1 ml 37.50 ml/m  AORTIC VALVE                     PULMONIC VALVE AV Area (Vmax):    1.72 cm      PV Vmax:       0.92 m/s AV Area (Vmean):   1.75 cm      PV Peak grad:  3.4 mmHg AV Area (VTI):     1.58 cm AV Vmax:           143.00 cm/s AV Vmean:          101.367 cm/s AV VTI:            0.278  m AV Peak  Grad:      8.2 mmHg AV Mean Grad:      4.7 mmHg LVOT Vmax:         78.33 cm/s LVOT Vmean:        56.467 cm/s LVOT VTI:          0.140 m LVOT/AV VTI ratio: 0.50  AORTA Ao Root diam: 2.60 cm Ao Asc diam:  2.70 cm MITRAL VALVE MV Area (PHT): 3.03 cm     SHUNTS MV Decel Time: 250 msec     Systemic VTI:  0.14 m MV E velocity: 110.00 cm/s  Systemic Diam: 2.00 cm Cherlynn Kaiser MD Electronically signed by Cherlynn Kaiser MD Signature Date/Time: 04/12/2021/2:14:47 PM    Final      Assessment and Plan:   Atrial fibrillation, RVR -Her heart rate has improved as her general medical condition has improved - She is not on any rate lowering medications at this time and her heart rate is on the high side of normal, but generally less than 100 - She does go above 100 with getting out of bed and walking around - She also has gets short of breath with exertion, but is not clear if this is from the A. fib, or from COVID - She is on midodrine for blood pressure support, SBP range is 82-135 in the last 24 hours - L atrium mildly dilated, R atrium normal -Because of her hepatic encephalopathy and history of autoimmune hepatitis, will not attempt anticoagulation until we can clear it with a liver specialist. -Once we can anticoagulate her, we can determine the best method for getting her back into sinus rhythm - Once her acute illnesses resolve, she may spontaneously convert.  2.  Hepatic encephalopathy - On lactulose and other meds per IM - She still has asterixis, but says she feels much better  3.  COVID - Her lung sounds are clear and she does not require O2 supplementation - Management per IM   Risk Assessment/Risk Scores:     CHA2DS2-VASc Score = 1   This indicates a 0.6% annual risk of stroke. The patient's score is based upon: CHF History: 0 HTN History: 0 Diabetes History: 0 Stroke History: 0 Vascular Disease History: 0 Age Score: 0 Gender Score: 1    For questions or updates, please  contact [place] Please consult [email or web address] for contact info under    Signed, Rosaria Ferries, PA-C  04/14/2021 4:30 PM

## 2021-04-14 NOTE — Evaluation (Signed)
Physical Therapy Re-Evaluation-1x Patient Details Name: Connie Larsen MRN: [id number] DOB: Jun 07, 1965 Today's Date: 04/14/2021  History of Present Illness  55 y.o. female with history of cirrhosis of liver and autoimmune hepatitis, hypothyroidism who has recently moved in from [place] to [place] was found this morning to be confused by patient's daughter.  Later in the evening when patient's daughter went to check on her again she was not talking well and had some slurred speech in addition to confusion. also found to be COVID +. New onset Afib with RVR    Clinical Impression  On re-eval( pt evaluated on 11/26 already), pt was Mod Ind with mobility. She walked ~200 around the unit . Pt denied dizziness. She tolerated activity well. Pt reports she has been mobilizing in room unassisted without difficulty. Pt does not have any acute PT needs. Pt does not feel she will have any issues managing at home. Will sign off.      Recommendations for follow up therapy are one component of a multi-disciplinary discharge planning process, led by the attending physician.  Recommendations may be updated based on patient status, additional functional criteria and insurance authorization.  Follow Up Recommendations No PT follow up    Assistance Recommended at Discharge PRN  Functional Status Assessment Patient has had a recent decline in their functional status and demonstrates the ability to make significant improvements in function in a reasonable and predictable amount of time.  Equipment Recommendations  None recommended by PT    Recommendations for Other Services       Precautions / Restrictions Precautions Precautions: Fall Precaution Comments: pt reported she has fallen 3 times at home Restrictions Weight Bearing Restrictions: No      Mobility  Bed Mobility Overal bed mobility: Independent                  Transfers Overall transfer level: Independent                       Ambulation/Gait Ambulation/Gait assistance: Modified independent (Device/Increase time) Gait Distance (Feet): 200 Feet Assistive device: None Gait Pattern/deviations: Step-through pattern;Decreased stride length       General Gait Details: Fair gait speed. Pt denied dizziness. Tolerated distance well. HR 101 bpm, O2 98% on RA, dypsnea 2/4 with ambulation.  Stairs            Wheelchair Mobility    Modified Rankin (Stroke Patients Only)       Balance Overall balance assessment: No apparent balance deficits (not formally assessed)                                           Pertinent Vitals/Pain Pain Assessment: No/denies pain    Home Living Family/patient expects to be discharged to:: Private residence Living Arrangements: Alone Available Help at Discharge: Family Type of Home: House Home Access: Level entry       Home Layout: One level Home Equipment: None      Prior Function Prior Level of Function : Independent/Modified Independent             Mobility Comments: pt reports she is independent with everything however does not drive. Dtr checks on pt however she admits she has not been great with organizing her medicines so her dtr is helping to get a plan for that.       Hand  Dominance        Extremity/Trunk Assessment   Upper Extremity Assessment Upper Extremity Assessment: Overall WFL for tasks assessed    Lower Extremity Assessment Lower Extremity Assessment: Overall WFL for tasks assessed    Cervical / Trunk Assessment Cervical / Trunk Assessment: Normal  Communication   Communication: No difficulties  Cognition Arousal/Alertness: Awake/alert Behavior During Therapy: WFL for tasks assessed/performed Overall Cognitive Status: Within Functional Limits for tasks assessed                                          General Comments      Exercises     Assessment/Plan    PT Assessment Patient  does not need any further PT services  PT Problem List         PT Treatment Interventions      PT Goals (Current goals can be found in the Care Plan section)  Acute Rehab PT Goals Patient Stated Goal: I want to feel better and go home when I can PT Goal Formulation: All assessment and education complete, DC therapy    Frequency     Barriers to discharge        Co-evaluation               AM-PAC PT "6 Clicks" Mobility  Outcome Measure Help needed turning from your back to your side while in a flat bed without using bedrails?: None Help needed moving from lying on your back to sitting on the side of a flat bed without using bedrails?: None Help needed moving to and from a bed to a chair (including a wheelchair)?: None Help needed standing up from a chair using your arms (e.g., wheelchair or bedside chair)?: None Help needed to walk in hospital room?: None Help needed climbing 3-5 steps with a railing? : A Little 6 Click Score: 23    End of Session Equipment Utilized During Treatment: Gait belt Activity Tolerance: Patient tolerated treatment well Patient left: in chair;with call bell/phone within reach        Time: 1003-1019 PT Time Calculation (min) (ACUTE ONLY): 16 min   Charges:   PT Evaluation $PT Re-evaluation: 1 Re-eval             Doreatha Massed, PT Acute Rehabilitation  Office: [phone] Pager: [phone]

## 2021-04-14 NOTE — Progress Notes (Signed)
PROGRESS NOTE    Connie Larsen  NPY:[id number] DOB: 07/30/1965 DOA: 04/11/2021 PCP: System, Provider Not In   Chief Complain:  Brief Narrative:  Patient is a 55 y.o. female with history of cirrhosis of liver and autoimmune hepatitis, hypothyroidism who has recently moved in from [place] to [place] was found this morning to be confused by patient's daughter Found to be in afib/rvr on presentation, elevated ammonia level and tested + for covid.  Cardiology consulted today for new onset A. fib.  Assessment & Plan:   Principal Problem:   Hepatic encephalopathy Active Problems:   Cirrhosis of liver (HCC)   Hypothyroidism   Hepatic encephalopathy -CT had MRI brain no acute findings --abdominal ultrasound did not reveal ascites -Continue lactulose and rifaximin for elevated ammonia level.  Ammonia was 144 on presentation.  55 today -We will continue lactulose and rifaximin on discharge. -Currently she is alert and oriented.   New onset of afib/presented with A. fib RVR No history of A. fib  her TSH is 3.9 Echo showed EF of 50 to 55%, indeterminate left ventricular diastolic parameters.  Cardiology consulted today.  EKG still shows A. fib but rate is controlled in the range of 90s. not on beta-blocker because of soft blood pressure   Hypotension Does not appear symptomatic  Started midodrine Continue midodrine for now Baseline cortisol was low.  ACTH stimulation test done with peak more than 18 ruling out adrenal insufficiency.  She was on prednisone in the past but it was more than a year ago   COVID infection -No hypoxia, chest x-ray no acute findings, lung exam clears -Most likely incidental COVID infection due to risk factor plan to treat, case discussed with pharmacy, will avoid remdesivir or Paxil elevated due to underlying liver disease ,started on molnupiravir s Monitor inflammatory marker Check Xray doesnot show an infiltrate  History of autoimmune hepatitis,   patient verified that she is on azathioprine 100 mg daily, she reported being tapered off steroid about a year ago.Mild elevated AST   Lymphopenia and thrombocytopenia could be from cirrhosis, monitor CBC   Hypothyroidism: Continue Synthroid   Fall:History of fall with large bruise on right arm from recent fall, right humerus x-ray no acute findings Patient report she only started fall recently, is wondering is related to her low blood pressure PT consulted and did not recommended a follow up   Class III obesity: Body mass index is 46.99 kg/m.Marland Kitchen           DVT prophylaxis:SCD Code Status: Full Family Communication: None at bedside Patient status:  Dispo: The patient is from: Home              Anticipated d/c is to: Home              Anticipated d/c date is: tomorrow after cardiology evaluation  Consultants: None  Procedures:None  Antimicrobials:  Anti-infectives (From admission, onward)    Start     Dose/Rate Route Frequency Ordered Stop   04/12/21 2200  rifaximin (XIFAXAN) tablet 550 mg        550 mg Oral 2 times daily 04/12/21 1337     04/12/21 2200  nirmatrelvir/ritonavir EUA (PAXLOVID) 3 tablet  Status:  Discontinued        3 tablet Oral 2 times daily 04/12/21 1851 04/12/21 1859   04/12/21 2200  molnupiravir EUA (LAGEVRIO) capsule 800 mg        4 capsule Oral 2 times daily 04/12/21 1859 04/17/21 2159  04/11/21 2300  rifaximin (XIFAXAN) tablet 200 mg  Status:  Discontinued        200 mg Oral 2 times daily 04/11/21 2219 04/12/21 1337       Subjective: Patient seen and examined at the bedside this afternoon.  Blood pressures improved than yesterday.  Systolic blood pressure in the range of 90s.  Denies any lightheadedness or dizziness.  On room air.  Alert oriented  Objective: Vitals:   04/13/21 1431 04/13/21 2127 04/14/21 0656 04/14/21 0848  BP: (!) 88/63 (!) 118/55 (!) 111/56 112/65  Pulse: 95 86 80 76  Resp: 20 15 20    Temp: 98.5 F (36.9 C) 98 F (36.7  C) 98 F (36.7 C)   TempSrc: Oral Oral Oral   SpO2: 94% 98% 96%   Weight:      Height:       No intake or output data in the 24 hours ending 04/14/21 1436 Filed Weights   04/11/21 1728  Weight: 136.1 kg    Examination:  General exam: Overall comfortable, not in distress,obese HEENT: PERRL Respiratory system:  no wheezes or crackles  Cardiovascular system: S1 & S2 heard, RRR.  Gastrointestinal system: Abdomen is nondistended, soft and nontender. Central nervous system: Alert and oriented Extremities: No edema, no clubbing ,no cyanosis Skin: No rashes, no ulcers,no icterus      Data Reviewed: I have personally reviewed following labs and imaging studies  CBC: Recent Labs  Lab 04/11/21 1741 04/12/21 0630 04/13/21 0526 04/14/21 0828  WBC 2.3* 2.8* 2.7* 2.6*  NEUTROABS 1.3* 1.3* 1.2* 1.3*  HGB 13.3 12.9 11.9* 12.3  HCT 39.1 37.7 35.6* 36.6  MCV 95.6 95.9 96.0 96.1  PLT 108* 112* 109* 118*   Basic Metabolic Panel: Recent Labs  Lab 04/11/21 1747 04/12/21 0630 04/13/21 0526 04/14/21 0828  NA 139 139 139 137  K 3.2* 3.4* 3.4* 3.8  CL 109 110 113* 109  CO2 24 22 22 22   GLUCOSE 105* 91 83 100*  BUN 10 8 10 11   CREATININE 1.02* 0.77 0.89 0.90  CALCIUM 8.1* 8.0* 8.1* 8.2*  MG 2.1  --  2.0  --    GFR: Estimated Creatinine Clearance: 103.1 mL/min (by C-G formula based on SCr of 0.9 mg/dL). Liver Function Tests: Recent Labs  Lab 04/11/21 1747 04/12/21 0630 04/13/21 0526 04/14/21 0828  AST 66* 59* 53* 52*  ALT 36 33 30 32  ALKPHOS 76 68 62 67  BILITOT 1.8* 2.0* 1.9* 2.2*  PROT 6.9 6.2* 5.7* 6.0*  ALBUMIN 3.0* 2.8* 2.5* 2.6*   No results for input(s): LIPASE, AMYLASE in the last 168 hours. Recent Labs  Lab 04/11/21 1746 04/12/21 0840 04/14/21 0828  AMMONIA 130* 144* 55*   Coagulation Profile: Recent Labs  Lab 04/11/21 1741  INR 1.2   Cardiac Enzymes: No results for input(s): CKTOTAL, CKMB, CKMBINDEX, TROPONINI in the last 168 hours. BNP (last  3 results) No results for input(s): PROBNP in the last 8760 hours. HbA1C: No results for input(s): HGBA1C in the last 72 hours. CBG: No results for input(s): GLUCAP in the last 168 hours. Lipid Profile: No results for input(s): CHOL, HDL, LDLCALC, TRIG, CHOLHDL, LDLDIRECT in the last 72 hours. Thyroid Function Tests: Recent Labs    04/11/21 1741  TSH 3.974  FREET4 0.86   Anemia Panel: No results for input(s): VITAMINB12, FOLATE, FERRITIN, TIBC, IRON, RETICCTPCT in the last 72 hours. Sepsis Labs: Recent Labs  Lab 04/11/21 1747  LATICACIDVEN 1.6    Recent Results (  from the past 240 hour(s))  Resp Panel by RT-PCR (Flu A&B, Covid) Urine, Clean Catch     Status: Abnormal   Collection Time: 04/11/21  8:19 PM   Specimen: Urine, Clean Catch; Nasopharyngeal(NP) swabs in vial transport medium  Result Value Ref Range Status   SARS Coronavirus 2 by RT PCR POSITIVE (A) NEGATIVE Final    Comment: RESULT CALLED TO, READ BACK BY AND VERIFIED WITH: ELLWANGER,A. 04/11/21 @22 :52 BY SEEL,M. (NOTE) SARS-CoV-2 target nucleic acids are DETECTED.  The SARS-CoV-2 RNA is generally detectable in upper respiratory specimens during the acute phase of infection. Positive results are indicative of the presence of the identified virus, but do not rule out bacterial infection or co-infection with other pathogens not detected by the test. Clinical correlation with patient history and other diagnostic information is necessary to determine patient infection status. The expected result is Negative.  Fact Sheet for Patients:  Fact Sheet for Healthcare Providers: BloggerCourse.com  This test is not yet approved or cleared by the [employer] FDA and  has been authorized for detection and/or diagnosis of SARS-CoV-2 by FDA under an Emergency Use Authorization (EUA).  This EUA will remain in effect (meaning this test  can be used) for the  duration of  the COVID-19 declaration under Section 564(b)(1) of the Act, 21 U.S.C. section 360bbb-3(b)(1), unless the authorization is terminated or revoked sooner.     Influenza A by PCR NEGATIVE NEGATIVE Final   Influenza B by PCR NEGATIVE NEGATIVE Final    Comment: (NOTE) The Xpert Xpress SARS-CoV-2/FLU/RSV plus assay is intended as an aid in the diagnosis of influenza from Nasopharyngeal swab specimens and should not be used as a sole basis for treatment. Nasal washings and aspirates are unacceptable for Xpert Xpress SARS-CoV-2/FLU/RSV testing.  Fact Sheet for Patients: [place]  Fact Sheet for Healthcare Providers: BloggerCourse.com  This test is not yet approved or cleared by the [employer] FDA and has been authorized for detection and/or diagnosis of SARS-CoV-2 by FDA under an Emergency Use Authorization (EUA). This EUA will remain in effect (meaning this test can be used) for the duration of the COVID-19 declaration under Section 564(b)(1) of the Act, 21 U.S.C. section 360bbb-3(b)(1), unless the authorization is terminated or revoked.  Performed at [facility], [address], [place], [place]   Culture, blood (routine x 2)     Status: None (Preliminary result)   Collection Time: 04/13/21  4:31 PM   Specimen: BLOOD RIGHT FOREARM  Result Value Ref Range Status   Specimen Description   Final    BLOOD RIGHT FOREARM Performed at [facility], [address], [place], [place]    Special Requests   Final    BOTTLES DRAWN AEROBIC ONLY Blood Culture adequate volume Performed at [facility], [address] [place], [place]    Culture PENDING  Incomplete   Report Status PENDING  Incomplete  Culture, blood (routine x 2)     Status: None (Preliminary result)   Collection Time: 04/13/21  4:31 PM   Specimen: BLOOD RIGHT ARM  Result Value  Ref Range Status   Specimen Description   Final    BLOOD RIGHT ARM Performed at [facility], [address] [place], [place]    Special Requests   Final    BOTTLES DRAWN AEROBIC ONLY Blood Culture adequate volume Performed at [facility], [address], [place], [place]  Culture PENDING  Incomplete   Report Status PENDING  Incomplete         Radiology Studies: US Abdomen Limited  Result Date: 04/12/2021 CLINICAL DATA:  Cirrhosis. EXAM: ULTRASOUND ABDOMEN LIMITED RIGHT UPPER QUADRANT COMPARISON:  None. FINDINGS: Suboptimal evaluation due to overlying bowel gas and patient body habitus. Gallbladder: Not visualized due to overlying bowel gas. Common bile duct: Diameter: 0.2 cm Liver: Suboptimal visualization. No focal lesion identified in the visualized parenchyma. Within normal limits in parenchymal echogenicity. Portal vein is patent on color Doppler imaging with normal direction of blood flow towards the liver. Other: None. IMPRESSION: Suboptimal evaluation due to patient body habitus and overlying bowel gas. Gallbladder is not visualized. No focal abnormality in the visualized portions of the liver. Electronically Signed   By: Sherron Ales M.D.   On: 04/12/2021 17:49        Scheduled Meds:  azaTHIOprine  100 mg Oral Daily   lactulose  30 g Oral TID   levothyroxine  150 mcg Oral Q0600   metoprolol tartrate  12.5 mg Oral BID   midodrine  5 mg Oral TID WC   molnupiravir EUA  4 capsule Oral BID   rifaximin  550 mg Oral BID   Continuous Infusions:   LOS: 2 days    Time spent: 25 mins.More than 50% of that time was spent in counseling and/or coordination of care.      Burnadette Pop, MD [facility] P11/28/2022, 2:36 PM

## 2021-04-15 LAB — CBC WITH DIFFERENTIAL/PLATELET
Abs Immature Granulocytes: 0.01 10*3/uL (ref 0.00–0.07)
Basophils Absolute: 0 10*3/uL (ref 0.0–0.1)
Basophils Relative: 1 %
Eosinophils Absolute: 0.2 10*3/uL (ref 0.0–0.5)
Eosinophils Relative: 5 %
HCT: 36.8 % (ref 36.0–46.0)
Hemoglobin: 12.7 g/dL (ref 12.0–15.0)
Immature Granulocytes: 0 %
Lymphocytes Relative: 28 %
Lymphs Abs: 1 10*3/uL (ref 0.7–4.0)
MCH: 32.7 pg (ref 26.0–34.0)
MCHC: 34.5 g/dL (ref 30.0–36.0)
MCV: 94.8 fL (ref 80.0–100.0)
Monocytes Absolute: 0.4 10*3/uL (ref 0.1–1.0)
Monocytes Relative: 10 %
Neutro Abs: 2.1 10*3/uL (ref 1.7–7.7)
Neutrophils Relative %: 56 %
Platelets: 133 10*3/uL — ABNORMAL LOW (ref 150–400)
RBC: 3.88 MIL/uL (ref 3.87–5.11)
RDW: 13.5 % (ref 11.5–15.5)
WBC: 3.7 10*3/uL — ABNORMAL LOW (ref 4.0–10.5)
nRBC: 0 % (ref 0.0–0.2)

## 2021-04-15 LAB — COMPREHENSIVE METABOLIC PANEL
ALT: 31 U/L (ref 0–44)
AST: 53 U/L — ABNORMAL HIGH (ref 15–41)
Albumin: 2.7 g/dL — ABNORMAL LOW (ref 3.5–5.0)
Alkaline Phosphatase: 65 U/L (ref 38–126)
Anion gap: 5 (ref 5–15)
BUN: 11 mg/dL (ref 6–20)
CO2: 21 mmol/L — ABNORMAL LOW (ref 22–32)
Calcium: 8.4 mg/dL — ABNORMAL LOW (ref 8.9–10.3)
Chloride: 108 mmol/L (ref 98–111)
Creatinine, Ser: 0.82 mg/dL (ref 0.44–1.00)
GFR, Estimated: >60 mL/min (ref >60)
Glucose, Bld: 108 mg/dL — ABNORMAL HIGH (ref 70–99)
Potassium: 3.5 mmol/L (ref 3.5–5.1)
Sodium: 134 mmol/L — ABNORMAL LOW (ref 135–145)
Total Bilirubin: 2.1 mg/dL — ABNORMAL HIGH (ref 0.3–1.2)
Total Protein: 6 g/dL — ABNORMAL LOW (ref 6.5–8.1)

## 2021-04-15 LAB — C-REACTIVE PROTEIN: CRP: 0.5 mg/dL (ref <1.0)

## 2021-04-15 LAB — D-DIMER, QUANTITATIVE: D-Dimer, Quant: 1 ug/mL-FEU — ABNORMAL HIGH (ref 0.00–0.50)

## 2021-04-15 MED ORDER — MIDODRINE HCL 10 MG PO TABS: 10.0000 mg | ORAL_TABLET | Freq: Three times a day (TID) | ORAL | 0 refills | Status: DC

## 2021-04-15 MED ORDER — RIFAXIMIN 550 MG PO TABS: 550.0000 mg | ORAL_TABLET | Freq: Two times a day (BID) | ORAL | 0 refills | Status: AC

## 2021-04-15 MED ORDER — LACTULOSE 10 GM/15ML PO SOLN: 30.0000 g | Freq: Three times a day (TID) | ORAL | 3 refills | Status: DC

## 2021-04-15 NOTE — Discharge Summary (Signed)
Physician Discharge Summary  Iyani Maxted [id number] DOB: [birth year] DOA: 04/11/2021  PCP: System, Provider Not In  Admit date: 04/11/2021 Discharge date: 04/15/2021  Admitted From: Home Disposition:  Home  Discharge Condition:Stable CODE STATUS:FULL Diet recommendation: Low sodium diet  Brief/Interim Summary:  Patient is a 55 y.o. female with history of cirrhosis of liver and autoimmune hepatitis, hypothyroidism who has recently moved in from [place] to [facility] was found this morning to be confused by patient's daughter Found to be in [place] on presentation, elevated ammonia level and tested + for covid.  Cardiology also consulted here  for new onset A. fib.  Hospital course remarkable for persistent soft blood pressure, slightly improved with midodrine.  She is medically stable for discharge to home today.  Her respiratory status is stable.  She is currently alert and oriented.  Following problems were addressed during her hospitalization:  Hepatic encephalopathy -CT had MRI brain no acute findings --abdominal ultrasound did not reveal ascites -Continue lactulose and rifaximin for elevated ammonia level.  Ammonia was 144 on presentation,much improved now -We will continue lactulose and rifaximin on discharge. -Currently she is alert and oriented.   New onset of afib/presented with A. fib RVR No history of A. fib  her TSH is 3.9 Echo showed EF of 50 to 55%, indeterminate left ventricular diastolic parameters.  Cardiology consulted today.  EKG still shows A. fib but rate is controlled in the range of 90s. not on beta-blocker or ca channel blocker  because of soft blood pressure.  CHA2DS2-VASc score of 1,not started on anticoagulation. She will be called by cardiology for follow-up appointment.   Hypotension Does not appear symptomatic  Started midodrine Baseline cortisol was low.  ACTH stimulation test done with peak more than 18 ruling out adrenal insufficiency.   She was on prednisone in the past but it was more than a year ago   COVID infection -No hypoxia, chest x-ray no acute findings, lung exam clears -Most likely incidental COVID infection due to risk factor plan to treat, case discussed with pharmacy, not started on  remdesivir or Paxil elevated due to underlying liver disease ,given few doses of  molnupiravir    History of autoimmune hepatitis,  patient verified that she is on azathioprine 100 mg , she reported being tapered off steroid about a year ago.Mildly elevated AST . We recommend to follow-up with hepatology as an outpatient   Lymphopenia and thrombocytopenia :could be from cirrhosis, monitor CBC   Hypothyroidism: Continue Synthroid   Fall:History of fall with large bruise on right arm from recent fall, right humerus x-ray no acute findings.PT consulted and did not recommended a follow up   Class III obesity: Body mass index is 46.99 kg/m.Marland Kitchen      Discharge Diagnoses:  Principal Problem:   Hepatic encephalopathy Active Problems:   Cirrhosis of liver (HCC)   Hypothyroidism    Discharge Instructions  Discharge Instructions     Diet general   Complete by: As directed    Low sodium diet   Discharge instructions   Complete by: As directed    1)Please take prescribed medications as instructed 2)Follow up with your PCP in a week 3)Monitor your blood pressure at home 4)You will be called by cardiology for follow-up appointment   Increase activity slowly   Complete by: As directed       Allergies as of 04/15/2021       Reactions   Tramadol Hcl Anaphylaxis   Aspirin Hives   Morphine  And Related Nausea And Vomiting   Tylenol [acetaminophen] Other (See Comments)   Was told not to take due to liver        Medication List     TAKE these medications    azathioprine 100 MG tablet Commonly known as: IMURAN Take 100 mg by mouth 2 (two) times daily.   escitalopram 20 MG tablet Commonly known as: LEXAPRO Take 20  mg by mouth daily.   ferrous sulfate 324 MG Tbec Take 324 mg by mouth daily with breakfast.   ibuprofen 200 MG tablet Commonly known as: ADVIL Take 400 mg by mouth every 6 (six) hours as needed for headache.   lactulose 10 GM/15ML solution Commonly known as: CHRONULAC Take 45 mLs (30 g total) by mouth 3 (three) times daily. What changed:  how much to take when to take this reasons to take this   levothyroxine 150 MCG tablet Commonly known as: SYNTHROID Take 150 mcg by mouth daily before breakfast. What changed: Another medication with the same name was removed. Continue taking this medication, and follow the directions you see here.   midodrine 10 MG tablet Commonly known as: PROAMATINE Take 1 tablet (10 mg total) by mouth 3 (three) times daily with meals.   rifaximin 550 MG Tabs tablet Commonly known as: XIFAXAN Take 1 tablet (550 mg total) by mouth 2 (two) times daily.   traZODone 50 MG tablet Commonly known as: DESYREL Take 50 mg by mouth at bedtime as needed for sleep.        Allergies  Allergen Reactions   Tramadol Hcl Anaphylaxis   Aspirin Hives   Morphine And Related Nausea And Vomiting   Tylenol [Acetaminophen] Other (See Comments)    Was told not to take due to liver    Consultations: Cardiology   Procedures/Studies: DG Chest 2 View  Result Date: 04/11/2021 CLINICAL DATA:  Altered mental status EXAM: CHEST - 2 VIEW COMPARISON:  None. FINDINGS: Borderline cardiomegaly. No focal opacity, pleural effusion or pneumothorax. IMPRESSION: No active cardiopulmonary disease. Electronically Signed   By: Donavan Foil M.D.   On: 04/11/2021 19:10   CT HEAD WO CONTRAST (5MM)  Result Date: 04/11/2021 CLINICAL DATA:  Delirium EXAM: CT HEAD WITHOUT CONTRAST TECHNIQUE: Contiguous axial images were obtained from the base of the skull through the vertex without intravenous contrast. COMPARISON:  None. FINDINGS: Brain: No evidence of acute infarction, hemorrhage,  hydrocephalus, extra-axial collection or mass lesion/mass effect. Vascular: No hyperdense vessel or unexpected calcification. Skull: Normal. Negative for fracture or focal lesion. Sinuses/Orbits: No acute finding. Lobulated mucosal thickening in the left maxillary sinus. Other: None IMPRESSION: Negative non contrasted CT appearance of the brain Electronically Signed   By: Donavan Foil M.D.   On: 04/11/2021 19:09   MR BRAIN WO CONTRAST  Result Date: 04/11/2021 CLINICAL DATA:  Delirium, stroke suspected EXAM: MRI HEAD WITHOUT CONTRAST TECHNIQUE: Multiplanar, multiecho pulse sequences of the brain and surrounding structures were obtained without intravenous contrast. COMPARISON:  No prior MRI, correlation is made with CT head 04/11/2021. FINDINGS: Brain: No restricted diffusion to suggest acute or subacute infarct. No acute hemorrhage mass mass effect, or midline shift. No extra-axial collection or hydrocephalus. No foci of hemosiderin deposition to suggest remote hemorrhage. Minimal T2 hyperintense signal in the periventricular white matter, likely the sequela of chronic small vessel ischemic disease. Vascular: Normal flow voids. Skull and upper cervical spine: Normal marrow signal. Sinuses/Orbits: Mucosal thickening in the left greater than right maxillary sinus and bilateral ethmoid air cells.  The orbits are unremarkable. Other: Fluid in the left-greater-than-right mastoid air cells. IMPRESSION: No acute intracranial process. Electronically Signed   By: Merilyn Baba M.D.   On: 04/11/2021 22:20   US Abdomen Limited  Result Date: 04/12/2021 CLINICAL DATA:  Cirrhosis. EXAM: ULTRASOUND ABDOMEN LIMITED RIGHT UPPER QUADRANT COMPARISON:  None. FINDINGS: Suboptimal evaluation due to overlying bowel gas and patient body habitus. Gallbladder: Not visualized due to overlying bowel gas. Common bile duct: Diameter: 0.2 cm Liver: Suboptimal visualization. No focal lesion identified in the visualized parenchyma. Within  normal limits in parenchymal echogenicity. Portal vein is patent on color Doppler imaging with normal direction of blood flow towards the liver. Other: None. IMPRESSION: Suboptimal evaluation due to patient body habitus and overlying bowel gas. Gallbladder is not visualized. No focal abnormality in the visualized portions of the liver. Electronically Signed   By: Ileana Roup M.D.   On: 04/12/2021 17:49   DG Humerus Right  Result Date: 04/12/2021 CLINICAL DATA:  Fall and right upper extremity pain. EXAM: RIGHT HUMERUS - 2+ VIEW COMPARISON:  None. FINDINGS: No acute fracture or dislocation. The bones are well mineralized. No arthritic changes. There is abrasion of the soft tissues of the dorsum of the elbow. No radiopaque foreign object. IMPRESSION: No acute/traumatic osseous pathology. Electronically Signed   By: Anner Crete M.D.   On: 04/12/2021 01:45   ECHOCARDIOGRAM COMPLETE  Result Date: 04/12/2021    ECHOCARDIOGRAM REPORT   Patient Name:   ESTLE ANN Vari Date of Exam: 04/12/2021 Medical Rec #:  [id number]       Height:       67.0 in Accession #:    [id number]      Weight:       300.0 lb Date of Birth:  06-12-1965      BSA:          2.403 m Patient Age:    27 years        BP:           109/68 mmHg Patient Gender: F               HR:           76 bpm. Exam Location:  Inpatient Procedure: 2D Echo, Cardiac Doppler and Color Doppler Indications:    Afib  History:        Patient has no prior history of Echocardiogram examinations.  Sonographer:    Glo Herring Referring Phys: [id number] Rise Patience  Sonographer Comments: Suboptimal apical window. IMPRESSIONS  1. Left ventricular ejection fraction, by estimation, is 50 to 55%. The left ventricle has low normal function. The left ventricle has no regional wall motion abnormalities. Left ventricular diastolic parameters are indeterminate.  2. Right ventricular systolic function is normal. The right ventricular size is normal. Tricuspid regurgitation  signal is inadequate for assessing PA pressure.  3. Left atrial size was mildly dilated.  4. The mitral valve is normal in structure. Trivial mitral valve regurgitation. No evidence of mitral stenosis.  5. The aortic valve is grossly normal. Aortic valve regurgitation is not visualized. No aortic stenosis is present. FINDINGS  Left Ventricle: Left ventricular ejection fraction, by estimation, is 50 to 55%. The left ventricle has low normal function. The left ventricle has no regional wall motion abnormalities. The left ventricular internal cavity size was normal in size. There is borderline left ventricular hypertrophy. Left ventricular diastolic parameters are indeterminate. Right Ventricle: The right ventricular size is normal. No increase in right  ventricular wall thickness. Right ventricular systolic function is normal. Tricuspid regurgitation signal is inadequate for assessing PA pressure. Left Atrium: Left atrial size was mildly dilated. Right Atrium: Right atrial size was normal in size. Pericardium: There is no evidence of pericardial effusion. Mitral Valve: The mitral valve is normal in structure. Trivial mitral valve regurgitation. No evidence of mitral valve stenosis. Tricuspid Valve: The tricuspid valve is normal in structure. Tricuspid valve regurgitation is trivial. No evidence of tricuspid stenosis. Aortic Valve: The aortic valve is grossly normal. Aortic valve regurgitation is not visualized. No aortic stenosis is present. Aortic valve mean gradient measures 4.7 mmHg. Aortic valve peak gradient measures 8.2 mmHg. Aortic valve area, by VTI measures 1.58 cm. Pulmonic Valve: The pulmonic valve was not well visualized. Pulmonic valve regurgitation is not visualized. No evidence of pulmonic stenosis. Aorta: The aortic root is normal in size and structure. Venous: The inferior vena cava was not well visualized. IAS/Shunts: The interatrial septum was not well visualized.  LEFT VENTRICLE PLAX 2D LVIDd:          4.80 cm   Diastology LVIDs:         3.50 cm   LV e' medial:    8.49 cm/s LV PW:         1.00 cm   LV E/e' medial:  13.0 LV IVS:        1.00 cm   LV e' lateral:   13.60 cm/s LVOT diam:     2.00 cm   LV E/e' lateral: 8.1 LV SV:         44 LV SV Index:   18 LVOT Area:     3.14 cm  RIGHT VENTRICLE RV Basal diam:  3.50 cm LEFT ATRIUM             Index        RIGHT ATRIUM           Index LA diam:        4.00 cm 1.66 cm/m   RA Area:     23.80 cm LA Vol (A2C):   72.7 ml 30.26 ml/m  RA Volume:   74.20 ml  30.88 ml/m LA Vol (A4C):   92.4 ml 38.46 ml/m LA Biplane Vol: 90.1 ml 37.50 ml/m  AORTIC VALVE                     PULMONIC VALVE AV Area (Vmax):    1.72 cm      PV Vmax:       0.92 m/s AV Area (Vmean):   1.75 cm      PV Peak grad:  3.4 mmHg AV Area (VTI):     1.58 cm AV Vmax:           143.00 cm/s AV Vmean:          101.367 cm/s AV VTI:            0.278 m AV Peak Grad:      8.2 mmHg AV Mean Grad:      4.7 mmHg LVOT Vmax:         78.33 cm/s LVOT Vmean:        56.467 cm/s LVOT VTI:          0.140 m LVOT/AV VTI ratio: 0.50  AORTA Ao Root diam: 2.60 cm Ao Asc diam:  2.70 cm MITRAL VALVE MV Area (PHT): 3.03 cm     SHUNTS MV Decel Time: 250 msec  Systemic VTI:  0.14 m MV E velocity: 110.00 cm/s  Systemic Diam: 2.00 cm Weston Brass MD Electronically signed by Weston Brass MD Signature Date/Time: 04/12/2021/2:14:47 PM    Final       Subjective: Patient seen and examined at the bedside this morning.  Hemodynamically stable for discharge.  Discharge Exam: Vitals:   04/14/21 2021 04/15/21 0334  BP:  (!) 95/52  Pulse: 77 99  Resp: 14 16  Temp: 98.6 F (37 C) 97.8 F (36.6 C)  SpO2: 97% 93%   Vitals:   04/14/21 0848 04/14/21 1456 04/14/21 2021 04/15/21 0334  BP: 112/65 (!) 91/55  (!) 95/52  Pulse: 76 90 77 99  Resp:  20 14 16   Temp:  98.4 F (36.9 C) 98.6 F (37 C) 97.8 F (36.6 C)  TempSrc:  Oral Oral Oral  SpO2:  97% 97% 93%  Weight:      Height:        General: Pt is alert,  awake, not in acute distress Cardiovascular: RRR, S1/S2 +, no rubs, no gallops Respiratory: CTA bilaterally, no wheezing, no rhonchi Abdominal: Soft, NT, ND, bowel sounds + Extremities: no edema, no cyanosis    The results of significant diagnostics from this hospitalization (including imaging, microbiology, ancillary and laboratory) are listed below for reference.     Microbiology: Recent Results (from the past 240 hour(s))  Resp Panel by RT-PCR (Flu A&B, Covid) Urine, Clean Catch     Status: Abnormal   Collection Time: 04/11/21  8:19 PM   Specimen: Urine, Clean Catch; Nasopharyngeal(NP) swabs in vial transport medium  Result Value Ref Range Status   SARS Coronavirus 2 by RT PCR POSITIVE (A) NEGATIVE Final    Comment: RESULT CALLED TO, READ BACK BY AND VERIFIED WITH: ELLWANGER,A. 04/11/21 @22 :52 BY SEEL,M. (NOTE) SARS-CoV-2 target nucleic acids are DETECTED.  The SARS-CoV-2 RNA is generally detectable in upper respiratory specimens during the acute phase of infection. Positive results are indicative of the presence of the identified virus, but do not rule out bacterial infection or co-infection with other pathogens not detected by the test. Clinical correlation with patient history and other diagnostic information is necessary to determine patient infection status. The expected result is Negative.  Fact Sheet for Patients: BloggerCourse.com  Fact Sheet for Healthcare Providers: SeriousBroker.it  This test is not yet approved or cleared by the [place] FDA and  has been authorized for detection and/or diagnosis of SARS-CoV-2 by FDA under an Emergency Use Authorization (EUA).  This EUA will remain in effect (meaning this test  can be used) for the duration of  the COVID-19 declaration under Section 564(b)(1) of the Act, 21 U.S.C. section 360bbb-3(b)(1), unless the authorization is terminated or revoked sooner.      Influenza A by PCR NEGATIVE NEGATIVE Final   Influenza B by PCR NEGATIVE NEGATIVE Final    Comment: (NOTE) The Xpert Xpress SARS-CoV-2/FLU/RSV plus assay is intended as an aid in the diagnosis of influenza from Nasopharyngeal swab specimens and should not be used as a sole basis for treatment. Nasal washings and aspirates are unacceptable for Xpert Xpress SARS-CoV-2/FLU/RSV testing.  Fact Sheet for Patients: BloggerCourse.com  Fact Sheet for Healthcare Providers: SeriousBroker.it  This test is not yet approved or cleared by the [place] FDA and has been authorized for detection and/or diagnosis of SARS-CoV-2 by FDA under an Emergency Use Authorization (EUA). This EUA will remain in effect (meaning this test can be used) for the duration of the COVID-19 declaration  under Section 564(b)(1) of the Act, 21 U.S.C. section 360bbb-3(b)(1), unless the authorization is terminated or revoked.  Performed at [facility], [address] [place], [place] [address]   Culture, blood (routine x 2)     Status: None (Preliminary result)   Collection Time: 04/13/21  4:31 PM   Specimen: BLOOD RIGHT FOREARM  Result Value Ref Range Status   Specimen Description   Final    BLOOD RIGHT FOREARM Performed at [facility], [address], [place], [place] [address]    Special Requests   Final    BOTTLES DRAWN AEROBIC ONLY Blood Culture adequate volume Performed at [facility], [address] [place], [place] [address]    Culture   Final    NO GROWTH 1 DAY Performed at [facility], [address], [place], [place] [address]    Report Status PENDING  Incomplete  Culture, blood (routine x 2)     Status: None (Preliminary result)   Collection Time: 04/13/21  4:31 PM   Specimen: BLOOD RIGHT ARM  Result Value Ref Range Status   Specimen Description   Final    BLOOD RIGHT ARM Performed at [facility], [address], [place], [place] [address]    Special Requests   Final    BOTTLES DRAWN AEROBIC ONLY Blood Culture adequate volume Performed at [facility], [address], [place], [place] [address]    Culture   Final    NO GROWTH 1 DAY Performed at [facility], [address] [place], [place] [address]    Report Status PENDING  Incomplete     Labs: BNP (last 3 results) No results for input(s): BNP in the last 8760 hours. Basic Metabolic Panel: Recent Labs  Lab 04/11/21 1747 04/12/21 0630 04/13/21 0526 04/14/21 0828 04/15/21 0459  NA 139 139 139 137 134*  K 3.2* 3.4* 3.4* 3.8 3.5  CL 109 110 113* 109 108  CO2 24 22 22 22  21*  GLUCOSE 105* 91 83 100* 108*  BUN 10 8 10 11 11   CREATININE 1.02* 0.77 0.89 0.90 0.82  CALCIUM 8.1* 8.0* 8.1* 8.2* 8.4*  MG 2.1  --  2.0  --   --    Liver Function Tests: Recent Labs  Lab 04/11/21 1747 04/12/21 0630 04/13/21 0526 04/14/21 0828 04/15/21 0459  AST 66* 59* 53* 52* 53*  ALT 36 33 30 32 31  ALKPHOS 76 68 62 67 65  BILITOT 1.8* 2.0* 1.9* 2.2* 2.1*  PROT 6.9 6.2* 5.7* 6.0* 6.0*  ALBUMIN 3.0* 2.8* 2.5* 2.6* 2.7*   No results for input(s): LIPASE, AMYLASE in the last 168 hours. Recent Labs  Lab 04/11/21 1746 04/12/21 0840 04/14/21 0828  AMMONIA 130* 144* 55*   CBC: Recent Labs  Lab 04/11/21 1741 04/12/21 0630 04/13/21 0526 04/14/21 0828 04/15/21 0459  WBC 2.3* 2.8* 2.7* 2.6* 3.7*  NEUTROABS 1.3* 1.3* 1.2* 1.3* 2.1  HGB 13.3 12.9 11.9* 12.3 12.7  HCT 39.1 37.7 35.6* 36.6 36.8  MCV 95.6 95.9 96.0 96.1 94.8  PLT 108* 112* 109* 118* 133*   Cardiac Enzymes: No results for input(s): CKTOTAL, CKMB, CKMBINDEX, TROPONINI in the last 168 hours. BNP: Invalid input(s): POCBNP CBG: No results for input(s): GLUCAP in the last 168 hours. D-Dimer Recent Labs    04/14/21 0828 04/15/21 0459  DDIMER 1.15* 1.00*   Hgb A1c No results for input(s): HGBA1C in the last 72  hours. Lipid Profile No results for input(s): CHOL, HDL, LDLCALC, TRIG, CHOLHDL, LDLDIRECT in the last 72 hours. Thyroid function studies No results for input(s): TSH, T4TOTAL, T3FREE, THYROIDAB in the last 72 hours.  Invalid input(s): FREET3 Anemia work up No results for input(s): VITAMINB12, FOLATE, FERRITIN, TIBC, IRON, RETICCTPCT in the last 72 hours. Urinalysis    Component Value Date/Time   COLORURINE YELLOW 04/11/2021 [place] 04/11/2021 1746   LABSPEC 1.006 04/11/2021 1746   PHURINE 6.0 04/11/2021 1746   GLUCOSEU NEGATIVE 04/11/2021 1746   HGBUR SMALL (A) 04/11/2021 1746   BILIRUBINUR NEGATIVE 04/11/2021 1746   KETONESUR NEGATIVE 04/11/2021 1746   PROTEINUR NEGATIVE 04/11/2021 1746   NITRITE NEGATIVE 04/11/2021 1746   LEUKOCYTESUR NEGATIVE 04/11/2021 1746   Sepsis Labs Invalid input(s): PROCALCITONIN,  WBC,  LACTICIDVEN Microbiology Recent Results (from the past 240 hour(s))  Resp Panel by RT-PCR (Flu A&B, Covid) Urine, Clean Catch     Status: Abnormal   Collection Time: 04/11/21  8:19 PM   Specimen: Urine, Clean Catch; Nasopharyngeal(NP) swabs in vial transport medium  Result Value Ref Range Status   SARS Coronavirus 2 by RT PCR POSITIVE (A) NEGATIVE Final    Comment: RESULT CALLED TO, READ BACK BY AND VERIFIED WITH: ELLWANGER,A. 04/11/21 @22 :52 BY SEEL,M. (NOTE) SARS-CoV-2 target nucleic acids are DETECTED.  The SARS-CoV-2 RNA is generally detectable in upper respiratory specimens during the acute phase of infection. Positive results are indicative of the presence of the identified virus, but do not rule out bacterial infection or co-infection with other pathogens not detected by the test. Clinical correlation with patient history and other diagnostic information is necessary to determine patient infection status. The expected result is Negative.  Fact Sheet for Patients: EntrepreneurPulse.com.au  Fact Sheet for Healthcare  Providers: IncredibleEmployment.be  This test is not yet approved or cleared by the [place] FDA and  has been authorized for detection and/or diagnosis of SARS-CoV-2 by FDA under an Emergency Use Authorization (EUA).  This EUA will remain in effect (meaning this test  can be used) for the duration of  the COVID-19 declaration under Section 564(b)(1) of the Act, 21 U.S.C. section 360bbb-3(b)(1), unless the authorization is terminated or revoked sooner.     Influenza A by PCR NEGATIVE NEGATIVE Final   Influenza B by PCR NEGATIVE NEGATIVE Final    Comment: (NOTE) The Xpert Xpress SARS-CoV-2/FLU/RSV plus assay is intended as an aid in the diagnosis of influenza from Nasopharyngeal swab specimens and should not be used as a sole basis for treatment. Nasal washings and aspirates are unacceptable for Xpert Xpress SARS-CoV-2/FLU/RSV testing.  Fact Sheet for Patients: EntrepreneurPulse.com.au  Fact Sheet for Healthcare Providers: IncredibleEmployment.be  This test is not yet approved or cleared by the [place] FDA and has been authorized for detection and/or diagnosis of SARS-CoV-2 by FDA under an Emergency Use Authorization (EUA). This EUA will remain in effect (meaning this test can be used) for the duration of the COVID-19 declaration under Section 564(b)(1) of the Act, 21 U.S.C. section 360bbb-3(b)(1), unless the authorization is terminated or revoked.  Performed at [facility], [place] [address], [place], [place] [address]   Culture, blood (routine x 2)     Status: None (Preliminary result)   Collection Time: 04/13/21  4:31 PM   Specimen: BLOOD RIGHT FOREARM  Result Value Ref Range Status   Specimen Description   Final    BLOOD RIGHT FOREARM Performed at [facility], [place]  [address] [place], [place] [address]    Special Requests   Final    BOTTLES DRAWN AEROBIC ONLY Blood Culture  adequate volume Performed at [place] [address] [place], [place] [address]    Culture   Final    NO GROWTH 1 DAY Performed at [facility], [place] [address] [place], [place] [address]    Report Status PENDING  Incomplete  Culture, blood (routine x 2)     Status: None (Preliminary result)   Collection Time: 04/13/21  4:31 PM   Specimen: BLOOD RIGHT ARM  Result Value Ref Range Status   Specimen Description   Final    BLOOD RIGHT ARM Performed at [facility], [place] [address], [place], [place] [address]    Special Requests   Final    BOTTLES DRAWN AEROBIC ONLY Blood Culture adequate volume Performed at [place] [address] [place], [place] [address]    Culture   Final    NO GROWTH 1 DAY Performed at [facility], [place] [address] [place], [place] [address]    Report Status PENDING  Incomplete    Please note: You were cared for by a hospitalist during your hospital stay. Once you are discharged, your primary care physician will handle any further medical issues. Please note that NO REFILLS for any discharge medications will be authorized once you are discharged, as it is imperative that you return to your primary care physician (or establish a relationship with a primary care physician if you do not have one) for your post hospital discharge needs so that they can reassess your need for medications and monitor your lab values.    Time coordinating discharge: 40 minutes  SIGNED:   Shelly Coss, MD  [facility] 04/15/2021, 10:47 AM Pager [phone]  If 7PM-7AM, please contact night-coverage [email or web address] Password [email or web address]

## 2021-04-15 NOTE — Care Management Important Message (Signed)
Important Message  Patient Details IM Letter given to the Patient. Name: Connie Larsen MRN: [id number] Date of Birth: 04-18-1966   Medicare Important Message Given:  Yes     Caren Macadam 04/15/2021, 10:54 AM

## 2021-04-15 NOTE — Progress Notes (Signed)
   04/15/21 1000  Mobility  Activity Ambulated in hall  Level of Assistance Modified independent, requires aide device or extra time  Assistive Device None  Distance Ambulated (ft) 500 ft  Mobility Ambulated with assistance in hallway  Mobility Response Tolerated well  Mobility performed by Mobility specialist  $Mobility charge 1 Mobility   Pt agreeable to mobilizing this morning. All precautions in place. Pt ambulated 56ft in hall with RW, tolerated well. No complaints. Left pt in chair with call bell at side and NT present. RN notified of session.   [facility] [employer] Mobility Specialist Acute Rehab Services Office: [phone]

## 2021-04-18 DIAGNOSIS — K754 Autoimmune hepatitis: Secondary | ICD-10-CM | POA: Diagnosis not present

## 2021-04-18 DIAGNOSIS — G250 Essential tremor: Secondary | ICD-10-CM | POA: Diagnosis not present

## 2021-04-18 DIAGNOSIS — K7460 Unspecified cirrhosis of liver: Secondary | ICD-10-CM | POA: Diagnosis not present

## 2021-04-18 DIAGNOSIS — G4700 Insomnia, unspecified: Secondary | ICD-10-CM | POA: Diagnosis not present

## 2021-04-18 DIAGNOSIS — I4891 Unspecified atrial fibrillation: Secondary | ICD-10-CM | POA: Diagnosis not present

## 2021-04-18 DIAGNOSIS — E039 Hypothyroidism, unspecified: Secondary | ICD-10-CM | POA: Diagnosis not present

## 2021-04-18 DIAGNOSIS — D649 Anemia, unspecified: Secondary | ICD-10-CM | POA: Diagnosis not present

## 2021-04-19 LAB — CULTURE, BLOOD (ROUTINE X 2)
Culture: NO GROWTH
Culture: NO GROWTH
Special Requests: ADEQUATE
Special Requests: ADEQUATE

## 2021-05-21 DIAGNOSIS — K766 Portal hypertension: Secondary | ICD-10-CM | POA: Diagnosis not present

## 2021-05-21 DIAGNOSIS — K7469 Other cirrhosis of liver: Secondary | ICD-10-CM | POA: Diagnosis not present

## 2021-05-21 DIAGNOSIS — K7682 Hepatic encephalopathy: Secondary | ICD-10-CM | POA: Diagnosis not present

## 2021-05-21 DIAGNOSIS — K754 Autoimmune hepatitis: Secondary | ICD-10-CM | POA: Diagnosis not present

## 2021-05-27 ENCOUNTER — Telehealth: Payer: Self-pay | Admitting: Cardiology

## 2021-05-27 ENCOUNTER — Ambulatory Visit (HOSPITAL_BASED_OUTPATIENT_CLINIC_OR_DEPARTMENT_OTHER): Payer: Medicare Other | Admitting: Cardiology

## 2021-05-27 NOTE — Telephone Encounter (Signed)
RN called patient to notify her that since we did not able to refill her medication for her. Encouraged her to reach out to her PCP for refills!

## 2021-05-27 NOTE — Progress Notes (Incomplete)
Cardiology Office Note:    Date:  05/27/2021   ID:  Connie Larsen, DOB Jul 01, 1965, MRN [id number]  PCP:  System, Provider Not In  Cardiologist:  Buford Dresser, MD  Referring MD: No ref. provider found   No chief complaint on file.   History of Present Illness:    Connie Larsen is a 55 y.o. female with a hx of cirrhosis of liver and autoimmune hepatitis, hypothyroidism, obesity, arthritis, PTSD, and depression, who is seen for post-hospital follow-up.  Connie Larsen was admitted 04/11/21 after she was found to be confused by her daughter. She was found to be in new onset Afib with RVR, elevated ammonia levels (144) and was positive for COVID. Echo showed EF 50-55% and indeterminate left ventricular diastolic parameters. EKG still showed Afib rate-controlled in the 90s. She was not on beta-blocker or ca channel blocker due to soft blood pressure. CHA2DS2-VASc score 1, not started on anticoagulation. Cardiology was consulted during her admission.  Today:   She denies any palpitations, chest pain, or shortness of breath. No lightheadedness, headaches, syncope, orthopnea, PND, lower extremity edema or exertional symptoms.  (+)  Past Medical History:  Diagnosis Date   Arthritis    Depression    Liver disease    PTSD (post-traumatic stress disorder)    Thyroid disease     Past Surgical History:  Procedure Laterality Date   ABDOMINAL HYSTERECTOMY      Current Medications: Current Outpatient Medications on File Prior to Visit  Medication Sig   azathioprine (IMURAN) 100 MG tablet Take 100 mg by mouth 2 (two) times daily.   escitalopram (LEXAPRO) 20 MG tablet Take 20 mg by mouth daily.   ferrous sulfate 324 MG TBEC Take 324 mg by mouth daily with breakfast.   ibuprofen (ADVIL) 200 MG tablet Take 400 mg by mouth every 6 (six) hours as needed for headache.   lactulose (CHRONULAC) 10 GM/15ML solution Take 45 mLs (30 g total) by mouth 3 (three) times daily.   levothyroxine  (SYNTHROID) 150 MCG tablet Take 150 mcg by mouth daily before breakfast.   midodrine (PROAMATINE) 10 MG tablet Take 1 tablet (10 mg total) by mouth 3 (three) times daily with meals.   rifaximin (XIFAXAN) 550 MG TABS tablet Take 1 tablet (550 mg total) by mouth 2 (two) times daily.   traZODone (DESYREL) 50 MG tablet Take 50 mg by mouth at bedtime as needed for sleep.   No current facility-administered medications on file prior to visit.     Allergies:   Tramadol hcl, Aspirin, Morphine and related, and Tylenol [acetaminophen]   Social History   Tobacco Use   Smoking status: Never   Smokeless tobacco: Never  Vaping Use   Vaping Use: Never used  Substance Use Topics   Alcohol use: Never   Drug use: Never    Family History: family history includes Arrhythmia in her brother and father; Heart disease in her mother and sister.  ROS:   Please see the history of present illness.  Additional pertinent ROS: Constitutional: Negative for chills, fever, night sweats, unintentional weight loss  HENT: Negative for ear pain and hearing loss.   Eyes: Negative for loss of vision and eye pain.  Respiratory: Negative for cough, sputum, wheezing.   Cardiovascular: See HPI. Gastrointestinal: Negative for abdominal pain, melena, and hematochezia.  Genitourinary: Negative for dysuria and hematuria.  Musculoskeletal: Negative for falls and myalgias.  Skin: Negative for itching and rash.  Neurological: Negative for focal weakness, focal sensory  changes and loss of consciousness.  Endo/Heme/Allergies: Does not bruise/bleed easily.     EKGs/Labs/Other Studies Reviewed:    The following studies were reviewed today:  Echo 04/12/2021: Sonographer Comments: Suboptimal apical window.  IMPRESSIONS    1. Left ventricular ejection fraction, by estimation, is 50 to 55%. The  left ventricle has low normal function. The left ventricle has no regional  wall motion abnormalities. Left ventricular diastolic  parameters are  indeterminate.   2. Right ventricular systolic function is normal. The right ventricular  size is normal. Tricuspid regurgitation signal is inadequate for assessing  PA pressure.   3. Left atrial size was mildly dilated.   4. The mitral valve is normal in structure. Trivial mitral valve  regurgitation. No evidence of mitral stenosis.   5. The aortic valve is grossly normal. Aortic valve regurgitation is not  visualized. No aortic stenosis is present.   EKG:  EKG is personally reviewed.   05/27/2021: ***  Recent Labs: 04/11/2021: TSH 3.974 04/13/2021: Magnesium 2.0 04/15/2021: ALT 31; BUN 11; Creatinine, Ser 0.82; Hemoglobin 12.7; Platelets 133; Potassium 3.5; Sodium 134   Recent Lipid Panel No results found for: CHOL, TRIG, HDL, CHOLHDL, VLDL, LDLCALC, LDLDIRECT  Physical Exam:    VS:  There were no vitals taken for this visit.    Wt Readings from Last 3 Encounters:  04/11/21 300 lb (136.1 kg)    GEN: Well nourished, well developed in no acute distress HEENT: Normal, moist mucous membranes NECK: No JVD CARDIAC: regular rhythm, normal S1 and S2, no rubs or gallops. No murmur. VASCULAR: Radial and DP pulses 2+ bilaterally. No carotid bruits RESPIRATORY:  Clear to auscultation without rales, wheezing or rhonchi  ABDOMEN: Soft, non-tender, non-distended MUSCULOSKELETAL:  Ambulates independently SKIN: Warm and dry, no edema NEUROLOGIC:  Alert and oriented x 3. No focal neuro deficits noted. PSYCHIATRIC:  Normal affect    ASSESSMENT:    No diagnosis found. PLAN:     Cardiac risk counseling and prevention recommendations: -recommend heart healthy/Mediterranean diet, with whole grains, fruits, vegetable, fish, lean meats, nuts, and olive oil. Limit salt. -recommend moderate walking, 3-5 times/week for 30-50 minutes each session. Aim for at least 150 minutes.week. Goal should be pace of 3 miles/hours, or walking 1.5 miles in 30 minutes -recommend avoidance of  tobacco products. Avoid excess alcohol. -ASCVD risk score: The ASCVD Risk score (Arnett DK, et al., 2019) failed to calculate for the following reasons:   Cannot find a previous HDL lab   Cannot find a previous total cholesterol lab    Plan for follow up: *** or sooner as needed.  Buford Dresser, MD, PhD, [facility]    Medication Adjustments/Labs and Tests Ordered: Current medicines are reviewed at length with the patient today.  Concerns regarding medicines are outlined above.   No orders of the defined types were placed in this encounter.  No orders of the defined types were placed in this encounter.  There are no Patient Instructions on file for this visit.   I,Mathew Stumpf,acting as a Education administrator for [employer], MD.,have documented all relevant documentation on the behalf of Buford Dresser, MD,as directed by  Buford Dresser, MD while in the presence of Buford Dresser, MD.  ***  Signed, Buford Dresser, MD PhD 05/27/2021 7:57 AM    [place]

## 2021-05-27 NOTE — Telephone Encounter (Signed)
°*  STAT* If patient is at the pharmacy, call can be transferred to refill team.   1. Which medications need to be refilled? (please list name of each medication and dose if known) escitalopram (LEXAPRO) 20 MG tablet  2. Which pharmacy/location (including street and city if local pharmacy) is medication to be sent to?[facility]  3. Do they need a 30 day or 90 day supply? 90 day

## 2021-05-30 DIAGNOSIS — E039 Hypothyroidism, unspecified: Secondary | ICD-10-CM | POA: Diagnosis not present

## 2021-06-11 ENCOUNTER — Ambulatory Visit (HOSPITAL_BASED_OUTPATIENT_CLINIC_OR_DEPARTMENT_OTHER): Payer: Medicare Other | Admitting: Cardiology

## 2021-06-11 ENCOUNTER — Other Ambulatory Visit: Payer: Self-pay

## 2021-06-11 VITALS — BP 110/62 | HR 68 | Ht 67.0 in | Wt 309.9 lb

## 2021-06-11 DIAGNOSIS — I499 Cardiac arrhythmia, unspecified: Secondary | ICD-10-CM

## 2021-06-11 DIAGNOSIS — K7469 Other cirrhosis of liver: Secondary | ICD-10-CM

## 2021-06-11 DIAGNOSIS — I480 Paroxysmal atrial fibrillation: Secondary | ICD-10-CM

## 2021-06-11 DIAGNOSIS — I959 Hypotension, unspecified: Secondary | ICD-10-CM

## 2021-06-11 DIAGNOSIS — Z7189 Other specified counseling: Secondary | ICD-10-CM | POA: Diagnosis not present

## 2021-06-11 HISTORY — DX: Cardiac arrhythmia, unspecified: I49.9

## 2021-06-11 NOTE — Progress Notes (Signed)
Cardiology Office Note:    Date:  06/11/2021   ID:  Connie Larsen, DOB 1966/01/01, MRN [id number]  PCP:  Leonides Sake, MD  Cardiologist:  Buford Dresser, MD  CC: post hospital follow up  History of Present Illness:    Connie Larsen is a 56 y.o. female with a hx of hypothyroidism, cirrhosis of liver, autoimmune hepatitis, arthritis, depression, and PTSD, who is seen for post-hospital follow-up.  Cardiac history: Connie Larsen was admitted 04/11/2021 after being found in a confused state by her daughter that morning. On presentation she was found to be in Afib with RVR, had elevated ammonia levels (144) and tested positive for COVID. Cardiology was consulted for new onset atrial fibrillation. Echo showed LVEF 50-55% and indeterminate left ventricular diastolic parameters. EKG showed Afib but was rate-controlled in the 90s. She was also noted to have persistent soft blood pressure, slightly improved with midodrine.  Exercise level: When she doesn't walk, she enjoys yoga. Current diet: One of her main concerns today is that she needs to confirm what her sodium intake should be. Lately she has been more conscientious of this in her diet.   She is accompanied by 2 family members. At this time she is weaning off of midodrine. Since her ED visit she is feeling pretty good overall, with no chest pain or palpitations. However, she continues to have shortness of breath that she attributes more to her weight.  She endorses bilateral LE edema that has been ongoing "for a while". Of note, her swelling is sometimes worse than it is in clinic today. She will try to perform leg and foot exercises, as well as yoga while seated.  Every once in a while, she suffers from difficulty breathing while lying down at night. Since this rarely occurs she believes this is due to anxiety.  She has been more active and spending time with her grandchildren.   She denies any palpitations, lightheadedness,  headaches, syncope, PND or exertional symptoms.  Past Medical History:  Diagnosis Date   Arthritis    Depression    Liver disease    PTSD (post-traumatic stress disorder)    Thyroid disease     Past Surgical History:  Procedure Laterality Date   ABDOMINAL HYSTERECTOMY      Current Medications: Current Outpatient Medications on File Prior to Visit  Medication Sig   azaTHIOprine (IMURAN) 50 MG tablet Take 100 mg by mouth daily.   escitalopram (LEXAPRO) 20 MG tablet Take 20 mg by mouth daily.   ferrous sulfate 325 (65 FE) MG tablet Take 1 tablet by mouth daily.   lactulose (CHRONULAC) 10 GM/15ML solution Take 45 mLs (30 g total) by mouth 3 (three) times daily.   levothyroxine (SYNTHROID) 100 MCG tablet Take 100 mcg by mouth daily.   midodrine (PROAMATINE) 10 MG tablet Take 20 mg by mouth daily.   Multiple Vitamin (MULTIVITAMIN WITH MINERALS) TABS tablet Take 2 tablets by mouth daily.   ondansetron (ZOFRAN-ODT) 8 MG disintegrating tablet Take by mouth as needed.   primidone (MYSOLINE) 50 MG tablet Take 50 mg by mouth daily.   traZODone (DESYREL) 50 MG tablet Take 50 mg by mouth at bedtime as needed for sleep.   Zinc 100 MG TABS Take 2 tablets by mouth daily.   rifaximin (XIFAXAN) 550 MG TABS tablet Take 1 tablet (550 mg total) by mouth 2 (two) times daily. (Patient not taking: Reported on 06/11/2021)   No current facility-administered medications on file prior to visit.  Allergies:   Tramadol hcl, Aspirin, Morphine and related, and Tylenol [acetaminophen]   Social History   Tobacco Use   Smoking status: Never   Smokeless tobacco: Never  Vaping Use   Vaping Use: Never used  Substance Use Topics   Alcohol use: Never   Drug use: Never    Family History: family history includes Arrhythmia in her brother and father; Heart disease in her mother and sister.  ROS:   Please see the history of present illness.  Additional pertinent ROS: Constitutional: Negative for chills,  fever, night sweats, unintentional weight loss  HENT: Negative for ear pain and hearing loss.   Eyes: Negative for loss of vision and eye pain.  Respiratory: Negative for cough, sputum, wheezing. Positive for shortness of breath. Cardiovascular: See HPI. Gastrointestinal: Negative for abdominal pain, melena, and hematochezia.  Genitourinary: Negative for dysuria and hematuria.  Musculoskeletal: Negative for falls and myalgias.  Skin: Negative for itching and rash.  Neurological: Negative for focal weakness, focal sensory changes and loss of consciousness.  Endo/Heme/Allergies: Does not bruise/bleed easily.     EKGs/Labs/Other Studies Reviewed:    The following studies were reviewed today:  Echo 04/12/2021: Sonographer Comments: Suboptimal apical window.  IMPRESSIONS    1. Left ventricular ejection fraction, by estimation, is 50 to 55%. The  left ventricle has low normal function. The left ventricle has no regional  wall motion abnormalities. Left ventricular diastolic parameters are  indeterminate.   2. Right ventricular systolic function is normal. The right ventricular  size is normal. Tricuspid regurgitation signal is inadequate for assessing  PA pressure.   3. Left atrial size was mildly dilated.   4. The mitral valve is normal in structure. Trivial mitral valve  regurgitation. No evidence of mitral stenosis.   5. The aortic valve is grossly normal. Aortic valve regurgitation is not  visualized. No aortic stenosis is present.   EKG:  EKG is personally reviewed.   06/11/2021: NSR at 68 bpm  Recent Labs: 04/11/2021: TSH 3.974 04/13/2021: Magnesium 2.0 04/15/2021: ALT 31; BUN 11; Creatinine, Ser 0.82; Hemoglobin 12.7; Platelets 133; Potassium 3.5; Sodium 134   Recent Lipid Panel No results found for: CHOL, TRIG, HDL, CHOLHDL, VLDL, LDLCALC, LDLDIRECT  Physical Exam:    VS:  BP 110/62 (BP Location: Left Arm, Patient Position: Sitting, Cuff Size: Large)    Pulse 68    Ht  5\' 7"  (1.702 m)    Wt (!) 309 lb 14.4 oz (140.6 kg)    BMI 48.54 kg/m     Wt Readings from Last 3 Encounters:  06/11/21 (!) 309 lb 14.4 oz (140.6 kg)  04/11/21 300 lb (136.1 kg)    GEN: Well nourished, well developed in no acute distress HEENT: Normal, moist mucous membranes NECK: No JVD CARDIAC: regular rhythm, normal S1 and S2, no rubs or gallops. No murmur. VASCULAR: Radial and DP pulses 2+ bilaterally. No carotid bruits RESPIRATORY:  Clear to auscultation without rales, wheezing or rhonchi  ABDOMEN: Soft, non-tender, non-distended MUSCULOSKELETAL:  Ambulates independently SKIN: Warm and dry, 1+ bilateral LE non-pitting edema NEUROLOGIC:  Alert and oriented x 3. No focal neuro deficits noted. PSYCHIATRIC:  Normal affect    ASSESSMENT:    1. Paroxysmal atrial fibrillation (HCC)   2. Hypotension, unspecified hypotension type   3. Other cirrhosis of liver ([place])   4. Cardiac risk counseling   5. Counseling on health promotion and disease prevention    PLAN:    Paroxysmal atrial fibrillation, in the setting of  Covid/hospitalization -has not noted recurrence, hopefully this was a self limited event in the setting of severe illness -with history of autoimmune hepatitis and cirrhosis, would be cautious with anticoagulation. Chadsvasc is only 1 currently, would not anticoagulate  Hypotension: -weaning off midodrine  Cardiac risk counseling and prevention recommendations: -recommend heart healthy/Mediterranean diet, with whole grains, fruits, vegetable, fish, lean meats, nuts, and olive oil. Limit salt. -recommend moderate walking, 3-5 times/week for 30-50 minutes each session. Aim for at least 150 minutes.week. Goal should be pace of 3 miles/hours, or walking 1.5 miles in 30 minutes -recommend avoidance of tobacco products. Avoid excess alcohol.  Plan for follow up: I would be happy to see her back as needed.  Buford Dresser, MD, PhD, [facility]     Medication Adjustments/Labs and Tests Ordered: Current medicines are reviewed at length with the patient today.  Concerns regarding medicines are outlined above.   Orders Placed This Encounter  Procedures   EKG 12-Lead   No orders of the defined types were placed in this encounter.  Patient Instructions  Medication Instructions:  Your Physician recommend you continue on your current medication as directed.    *If you need a refill on your cardiac medications before your next appointment, please call your pharmacy*   Lab Work: None ordered today   Testing/Procedures: None ordered today   Follow-Up: At [facility], you and your health needs are our priority.  As part of our continuing mission to provide you with exceptional heart care, we have created designated Provider Care Teams.  These Care Teams include your primary Cardiologist (physician) and Advanced Practice Providers (APPs -  Physician Assistants and Nurse Practitioners) who all work together to provide you with the care you need, when you need it.  We recommend signing up for the patient portal called "MyChart".  Sign up information is provided on this After Visit Summary.  MyChart is used to connect with patients for Virtual Visits (Telemedicine).  Patients are able to view lab/test results, encounter notes, upcoming appointments, etc.  Non-urgent messages can be sent to your provider as well.   To learn more about what you can do with MyChart, go to [email or web address].    Your next appointment:   As needed  The format for your next appointment:   In Person  Provider:   Buford Dresser, MD    Aim (in a perfect world) for less than 2000 mg sodium per day, but definitely less than 3000 mg per day.   I,Mathew Stumpf,acting as a Education administrator for [employer], MD.,have documented all relevant documentation on the behalf of Buford Dresser, MD,as directed by  Buford Dresser, MD while in  the presence of Buford Dresser, MD.  I, Buford Dresser, MD, have reviewed all documentation for this visit. The documentation on 07/04/21 for the exam, diagnosis, procedures, and orders are all accurate and complete.   Signed, Buford Dresser, MD PhD 06/11/2021     [place]

## 2021-06-11 NOTE — Patient Instructions (Addendum)
Medication Instructions:  Your Physician recommend you continue on your current medication as directed.    *If you need a refill on your cardiac medications before your next appointment, please call your pharmacy*   Lab Work: None ordered today   Testing/Procedures: None ordered today   Follow-Up: At [facility], you and your health needs are our priority.  As part of our continuing mission to provide you with exceptional heart care, we have created designated Provider Care Teams.  These Care Teams include your primary Cardiologist (physician) and Advanced Practice Providers (APPs -  Physician Assistants and Nurse Practitioners) who all work together to provide you with the care you need, when you need it.  We recommend signing up for the patient portal called "MyChart".  Sign up information is provided on this After Visit Summary.  MyChart is used to connect with patients for Virtual Visits (Telemedicine).  Patients are able to view lab/test results, encounter notes, upcoming appointments, etc.  Non-urgent messages can be sent to your provider as well.   To learn more about what you can do with MyChart, go to [email or web address].    Your next appointment:   As needed  The format for your next appointment:   In Person  Provider:   Jodelle Red, MD    Aim (in a perfect world) for less than 2000 mg sodium per day, but definitely less than 3000 mg per day.

## 2021-07-04 ENCOUNTER — Encounter (HOSPITAL_BASED_OUTPATIENT_CLINIC_OR_DEPARTMENT_OTHER): Payer: Self-pay | Admitting: Cardiology

## 2021-07-25 DIAGNOSIS — E039 Hypothyroidism, unspecified: Secondary | ICD-10-CM | POA: Diagnosis not present

## 2021-07-29 ENCOUNTER — Encounter (HOSPITAL_COMMUNITY): Payer: Self-pay

## 2021-07-29 ENCOUNTER — Emergency Department (HOSPITAL_COMMUNITY): Payer: Medicare Other

## 2021-07-29 ENCOUNTER — Emergency Department (HOSPITAL_COMMUNITY)
Admission: EM | Admit: 2021-07-29 | Discharge: 2021-07-30 | Disposition: A | Discharge status: Home / Self Care | Payer: Medicare Other | Attending: Emergency Medicine | Admitting: Emergency Medicine

## 2021-07-29 ENCOUNTER — Other Ambulatory Visit: Payer: Self-pay

## 2021-07-29 DIAGNOSIS — S99911A Unspecified injury of right ankle, initial encounter: Secondary | ICD-10-CM | POA: Diagnosis present

## 2021-07-29 DIAGNOSIS — S8261XA Displaced fracture of lateral malleolus of right fibula, initial encounter for closed fracture: Secondary | ICD-10-CM | POA: Diagnosis not present

## 2021-07-29 DIAGNOSIS — W010XXA Fall on same level from slipping, tripping and stumbling without subsequent striking against object, initial encounter: Secondary | ICD-10-CM | POA: Diagnosis not present

## 2021-07-29 DIAGNOSIS — R600 Localized edema: Secondary | ICD-10-CM | POA: Diagnosis not present

## 2021-07-29 DIAGNOSIS — M25571 Pain in right ankle and joints of right foot: Secondary | ICD-10-CM | POA: Diagnosis not present

## 2021-07-29 MED ORDER — FENTANYL CITRATE PF 50 MCG/ML IJ SOSY
50.0000 ug | PREFILLED_SYRINGE | Freq: Once | INTRAMUSCULAR | Status: AC
  Administered 2021-07-29: 50 ug via INTRAMUSCULAR
  Filled 2021-07-29: qty 1

## 2021-07-29 NOTE — ED Triage Notes (Signed)
Right ankle pain after standing up and rolling ankle yesterday. Unable to bare weight.  ?

## 2021-07-29 NOTE — ED Notes (Signed)
Call to ortho tech for splint placement, will come place splint. Pt updated by PA ?

## 2021-07-29 NOTE — ED Notes (Signed)
Patient transported to CT 

## 2021-07-29 NOTE — Discharge Instructions (Addendum)
Please pick up pain medication and take as prescribed ? ?Follow up with Dr. Odis Hollingshead this week for further eval - call their office tomorrow to schedule an appointment.  ? ?DO NOT GET SPLINT WET. Do not bare weight onto your leg until you can be seen by ortho. Use crutches as needed.  ? ?Return to the ED for any new/worsening symptoms ?

## 2021-07-29 NOTE — ED Provider Notes (Signed)
?[facility]PT ?Provider Note ? ? ?CSN: [id number] ?Arrival date & time: 07/29/21  1930 ? ?  ? ?History ? ?Chief Complaint  ?Patient presents with  ? Ankle Pain  ? ? ?Connie Larsen is a 56 y.o. female who presents to the ED today with complaint of sudden onset, constant, sharp, right ankle pain status post mechanical fall that occurred last night.  Patient states that she twisted her ankle causing her to fall down.  She has been unable to bear weight since that time.  She has been taking ibuprofen without relief.  She denies any head injury or loss of consciousness.  No other complaints at this time. ? ?The history is provided by the patient and medical records.  ? ?  ? ?Home Medications ?Prior to Admission medications   ?Medication Sig Start Date End Date Taking? Authorizing Provider  ?oxyCODONE (ROXICODONE) 5 MG immediate release tablet Take 1 tablet (5 mg total) by mouth every 6 (six) hours as needed for severe pain. 07/30/21  Yes Dempsey Knotek, PA-C  ?azaTHIOprine (IMURAN) 50 MG tablet Take 100 mg by mouth daily. 04/18/21   [provider]  ?escitalopram (LEXAPRO) 20 MG tablet Take 20 mg by mouth daily. 10/19/20   [provider]  ?ferrous sulfate 325 (65 FE) MG tablet Take 1 tablet by mouth daily. 07/04/20   [provider]  ?lactulose (CHRONULAC) 10 GM/15ML solution Take 45 mLs (30 g total) by mouth 3 (three) times daily. 04/15/21   Shelly Coss, MD  ?levothyroxine (SYNTHROID) 100 MCG tablet Take 100 mcg by mouth daily. 05/31/21   [provider]  ?midodrine (PROAMATINE) 10 MG tablet Take 20 mg by mouth daily.    [provider]  ?Multiple Vitamin (MULTIVITAMIN WITH MINERALS) TABS tablet Take 2 tablets by mouth daily.    [provider]  ?primidone (MYSOLINE) 50 MG tablet Take 50 mg by mouth daily. 04/19/21   [provider]  ?rifaximin (XIFAXAN) 550 MG TABS tablet Take 1 tablet (550 mg total) by mouth 2 (two) times  daily. ?Patient not taking: Reported on 06/11/2021 04/15/21   Shelly Coss, MD  ?traZODone (DESYREL) 50 MG tablet Take 50 mg by mouth at bedtime as needed for sleep.    [provider]  ?Zinc 100 MG TABS Take 2 tablets by mouth daily.    [provider]  ?   ? ?Allergies    ?Aspirin, Tramadol, Tramadol hcl, Morphine and related, Tylenol [acetaminophen], Morphine, and Prednisone   ? ?Review of Systems   ?Review of Systems  ?Constitutional:  Negative for chills and fever.  ?Musculoskeletal:  Positive for arthralgias.  ?Skin:  Negative for color change and wound.  ?All other systems reviewed and are negative. ? ?Physical Exam ?Updated Vital Signs ?BP (!) 129/54 (BP Location: Left Arm)   Pulse 86   Temp 99.2 ?F (37.3 ?C) (Oral)   Resp 18   Ht 5\' 7"  (1.702 m)   Wt 135.6 kg   SpO2 98%   BMI 46.83 kg/m?  ?Physical Exam ?Vitals and nursing note reviewed.  ?Constitutional:   ?   Appearance: She is obese. She is not ill-appearing.  ?HENT:  ?   Head: Normocephalic and atraumatic.  ?Eyes:  ?   Conjunctiva/sclera: Conjunctivae normal.  ?Cardiovascular:  ?   Rate and Rhythm: Normal rate and regular rhythm.  ?Pulmonary:  ?   Effort: Pulmonary effort is normal.  ?   Breath sounds: Normal breath sounds.  ?Musculoskeletal:  ?  Comments: Moderate swelling noted to right ankle with tenderness palpation diffusely to ankle joint.  Range of motion limited secondary to pain however patient able to dorsiflex and plantarflex.  Able to wiggle toes without difficulty.  Cap refill less than 2 seconds to all digits.  2+ DP pulse.  ?Skin: ?   General: Skin is warm and dry.  ?   Coloration: Skin is not jaundiced.  ?Neurological:  ?   Mental Status: She is alert.  ? ? ?ED Results / Procedures / Treatments   ?Labs ?(all labs ordered are listed, but only abnormal results are displayed) ?Labs Reviewed - No data to display ? ?EKG ?None ? ?Radiology ?DG Ankle Complete Right ? ?Result Date: 07/29/2021 ?CLINICAL DATA:  Right  ankle pain. EXAM: RIGHT ANKLE - COMPLETE 3+ VIEW COMPARISON:  None. FINDINGS: Mildly displaced oblique fracture of the distal fibula and lateral malleolus with approximately 3 mm distraction gap. No other acute fracture. Mild osteopenia. No dislocation. The ankle mortise is intact. Mild soft tissue swelling of the ankle. No radiopaque foreign object or soft tissue gas. IMPRESSION: Mildly displaced oblique fracture of the lateral malleolus. Electronically Signed   By: Anner Crete M.D.   On: 07/29/2021 21:00  ? ?CT ANKLE RIGHT WO CONTRAST ? ?Result Date: 07/29/2021 ?CLINICAL DATA:  Ankle fracture. EXAM: CT OF THE RIGHT ANKLE WITHOUT CONTRAST TECHNIQUE: Multidetector CT imaging of the right ankle was performed according to the standard protocol. Multiplanar CT image reconstructions were also generated. RADIATION DOSE REDUCTION: This exam was performed according to the departmental dose-optimization program which includes automated exposure control, adjustment of the mA and/or kV according to patient size and/or use of iterative reconstruction technique. COMPARISON:  07/29/2021. FINDINGS: Bones/Joint/Cartilage There is a mildly displaced oblique fracture of the distal fibula, unchanged from the prior exam. A few calcifications are seen inferior to the medial malleolus suggesting avulsion fractures. There is widening of the tibiotalar joint laterally at the talar dome suggesting instability and likely ligamental injury. Degenerative changes are noted in the midfoot. Corticated bony densities are noted in the soft tissues adjacent to the navicular bone which are likely chronic. Ligaments Suboptimally assessed by CT. Muscles and Tendons The visualized musculotendinous structures are within normal limits. Soft tissues Subcutaneous edema and fat stranding are noted. No large focal hematoma is identified. IMPRESSION: 1. Mildly displaced oblique fracture of the lateral malleolus. 2. Tiny bony densities inferior to the  medial malleolus suggesting avulsion fracture. 3. There is widening of the tibiotalar joint laterally at the talar dome which may be associated with instability and ligamental injury. Electronically Signed   By: Brett Fairy M.D.   On: 07/29/2021 23:40   ? ?Procedures ?Procedures  ? ? ?Medications Ordered in ED ?Medications  ?fentaNYL (SUBLIMAZE) injection 50 mcg (50 mcg Intramuscular Given 07/29/21 2310)  ? ? ?ED Course/ Medical Decision Making/ A&P ?  ?                        ?Medical Decision Making ?56 year old female who presents to the ED status post mechanical fall that occurred last night complaining of right ankle pain.  X-ray ordered prior to being seen, confirmed mildly displaced oblique fracture of the lateral malleolus.  We will plan to consult orthopedics for further evaluation. ? ?CT scan obtained and recommended by orthopedist.  Patient to have posterior splint applied and crutches provided with plans for Ortho follow-up in the near future.  Pain medication provided in the ED  at this time. Sent home with short course for same. RICE therapy discussed. Pt in agreement with plan and stable for discharge after splint applied.  ? ?Problems Addressed: ?Closed displaced fracture of lateral malleolus of right fibula, initial encounter: acute illness or injury ? ?Amount and/or Complexity of Data Reviewed ?Radiology: ordered. ?   Details: Xray of R ankle viewed by me - distal fibular fx ?Discussion of management or test interpretation with external provider(s): Discussed case with orthopedist Dr. Rolena Infante who recommends CT scan, posterior splint, and outpatient follow up with Dr. Kathaleen Bury ? ?Risk ?Prescription drug management. ? ? ? ? ? ? ? ? ? ?Final Clinical Impression(s) / ED Diagnoses ?Final diagnoses:  ?Closed displaced fracture of lateral malleolus of right fibula, initial encounter  ? ? ?Rx / DC Orders ?ED Discharge Orders   ? ?      Ordered  ?  oxyCODONE (ROXICODONE) 5 MG immediate release tablet   Every 6 hours PRN       ? 07/30/21 0001  ? ?  ?  ? ?  ? ? ? ?Discharge Instructions   ? ?  ?Please pick up pain medication and take as prescribed ? ?Follow up with Dr. Kathaleen Bury this week for further eval - call their

## 2021-07-30 MED ORDER — OXYCODONE HCL 5 MG PO TABS: 5.0000 mg | ORAL_TABLET | Freq: Four times a day (QID) | ORAL | 0 refills | Status: DC | PRN

## 2021-07-31 DIAGNOSIS — Z01818 Encounter for other preprocedural examination: Secondary | ICD-10-CM | POA: Diagnosis not present

## 2021-07-31 DIAGNOSIS — S8261XA Displaced fracture of lateral malleolus of right fibula, initial encounter for closed fracture: Secondary | ICD-10-CM | POA: Diagnosis not present

## 2021-07-31 DIAGNOSIS — M25571 Pain in right ankle and joints of right foot: Secondary | ICD-10-CM | POA: Diagnosis not present

## 2021-08-01 ENCOUNTER — Encounter (HOSPITAL_COMMUNITY): Payer: Self-pay | Admitting: Orthopaedic Surgery

## 2021-08-01 NOTE — Progress Notes (Signed)
DUE TO COVID-19 ONLY ONE VISITOR IS ALLOWED TO COME WITH YOU AND STAY IN THE WAITING ROOM ONLY DURING PRE OP AND PROCEDURE DAY OF SURGERY.  ? ?PCP - Dr Daiva Eves ?Cardiologist - Dr Buford Dresser ? ?Chest x-ray - 04/11/21 (2V) ?EKG - 06/11/21 ?Stress Test - n/a ?ECHO - 04/12/21 ?Cardiac Cath - n/a ? ?ICD Pacemaker/Loop - n/a ? ?Sleep Study -  Yes ?CPAP - Son Legrand Como not sure ? ?Aspirin Instructions: Follow your surgeon's instructions on when to stop aspirin prior to surgery,  If no instructions were given by your surgeon then you will need to call the office for those instructions. ? ?STOP now taking any Aspirin (unless otherwise instructed by your surgeon), Aleve, Naproxen, Ibuprofen, Motrin, Advil, Goody's, BC's, all herbal medications, fish oil, and all vitamins.  ? ?Coronavirus Screening ?Covid test n/a Ambulatory Surgery  ?Do you have any of the following symptoms:  ?Cough yes/no: No ?Fever (>100.73F)  yes/no: No ?Runny nose yes/no: No ?Sore throat yes/no: No ?Difficulty breathing/shortness of breath  yes/no: No ? ?Have you traveled in the last 14 days and where? yes/no: No ? ?Son Eyva Heron [phone] verbalized understanding of instructions that were given via phone. ?

## 2021-08-01 NOTE — Anesthesia Preprocedure Evaluation (Addendum)
Anesthesia Evaluation  ?Patient identified by MRN, date of birth, ID band ?Patient awake ? ? ? ?Reviewed: ?Allergy & Precautions, NPO status , Patient's Chart, lab work & pertinent test results ? ?History of Anesthesia Complications ?Negative for: history of anesthetic complications ? ?Airway ?Mallampati: II ? ?TM Distance: >3 FB ?Neck ROM: Full ? ? ? Dental ? ?(+) Dental Advisory Given, Edentulous Lower, Edentulous Upper ?  ?Pulmonary ?asthma , sleep apnea ,  ?  ?Pulmonary exam normal ?breath sounds clear to auscultation ? ? ? ? ? ? Cardiovascular ?(-) angina(-) Past MI Normal cardiovascular exam+ dysrhythmias Atrial Fibrillation  ?Rhythm:Regular Rate:Normal ? ? ?  ?Neuro/Psych ?PSYCHIATRIC DISORDERS Anxiety Depression negative neurological ROS ?   ? GI/Hepatic ?negative GI ROS, (+) Cirrhosis  ?  ?  ? ,   ?Endo/Other  ?Hypothyroidism Morbid obesity ? Renal/GU ?negative Renal ROS  ? ?  ?Musculoskeletal ? ?(+) Arthritis , displaced fracture of lateral malleolus or right fibula  ? Abdominal ?  ?Peds ? Hematology ?negative hematology ROS ?(+)   ?Anesthesia Other Findings ?Day of surgery medications reviewed with the patient. ? Reproductive/Obstetrics ? ?  ? ? ? ? ? ? ? ? ? ? ? ? ? ?  ?  ? ? ? ? ? ? ? ?Anesthesia Physical ?Anesthesia Plan ? ?ASA: 3 ? ?Anesthesia Plan: General  ? ?Post-op Pain Management: Regional block*  ? ?Induction: Intravenous ? ?PONV Risk Score and Plan: 3 and Midazolam, Dexamethasone and Ondansetron ? ?Airway Management Planned: Oral ETT ? ?Additional Equipment:  ? ?Intra-op Plan:  ? ?Post-operative Plan: Extubation in OR ? ?Informed Consent: I have reviewed the patients History and Physical, chart, labs and discussed the procedure including the risks, benefits and alternatives for the proposed anesthesia with the patient or authorized representative who has indicated his/her understanding and acceptance.  ? ? ? ?Dental advisory given ? ?Plan Discussed with:  CRNA ? ?Anesthesia Plan Comments: (PAT note by Antionette Poles, PA-CL: ?Patient was admitted 04/11/2021 after being found in a confused state by her daughter that morning. On presentation she was found to be in Afib with RVR, had elevated ammonia levels (144) and tested positive for COVID. Cardiology was consulted for new onset atrial fibrillation. Echo showed LVEF 50-55% and indeterminate left ventricular diastolic parameters. EKG showed Afib but was rate-controlled in the 90s. She was also noted to have persistent soft blood pressure, slightly improved with midodrine.  She was subsequently seen outpatient by cardiologist Dr. Cristal Deer on 06/11/2021.  Per note, patient had not had any recurrence of A-fib it was felt that it may have been a self-limited event in the setting of severe illness.  Given history of autoimmune hepatitis and cirrhosis, it was felt that the risk of anticoagulation likely outweighed the benefit.  CHA2DS2-VASc was only 1 and decision was made not to anticoagulate.  Patient was advised to follow-up with cardiology on an as-needed basis. ? 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  Per most recent office visit note 05/21/2021, MELD score was calculated at 11 at that time ? ?Pt will need DOS labs and evaluation.  ? ?EKG 06/11/21: NSR. Rate 68. ? ?Echo 04/12/2021: ?Sonographer Comments: Suboptimal apical window.  ?IMPRESSIONS  ? ??1. Left ventricular ejection fraction, by estimation, is 50 to 55%. The  ?left ventricle has low normal function. The left ventricle has no regional  ?wall motion abnormalities. Left ventricular diastolic parameters are  ?indeterminate.  ??2.  Right ventricular systolic function is normal. The right ventricular  ?size is normal. Tricuspid regurgitation signal is inadequate for assessing  ?PA pressure.  ??3. Left atrial size was mildly dilated.  ??4. The mitral valve is normal in structure.  Trivial mitral valve  ?regurgitation. No evidence of mitral stenosis.  ??5. The aortic valve is grossly normal. Aortic valve regurgitation is not  ?visualized. No aortic stenosis is present.  ? ?)  ? ? ? ? ? ?Anesthesia Quick Evaluation ? ?

## 2021-08-01 NOTE — Progress Notes (Signed)
Anesthesia Chart Review: ?Same-day work-up ? ?Patient was admitted 04/11/2021 after being found in a confused state by her daughter that morning. On presentation she was found to be in Afib with RVR, had elevated ammonia levels (144) and tested positive for COVID. Cardiology was consulted for new onset atrial fibrillation. Echo showed LVEF 50-55% and indeterminate left ventricular diastolic parameters. EKG showed Afib but was rate-controlled in the 90s. She was also noted to have persistent soft blood pressure, slightly improved with midodrine.  She was subsequently seen outpatient by cardiologist Dr. Harrell Gave on 06/11/2021.  Per note, patient had not had any recurrence of A-fib it was felt that it may have been a self-limited event in the setting of severe illness.  Given history of autoimmune hepatitis and cirrhosis, it was felt that the risk of anticoagulation likely outweighed the benefit.  CHA2DS2-VASc was only 1 and decision was made not to anticoagulate.  Patient was advised to follow-up with cardiology on an as-needed basis. ? 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  Per most recent office visit note 05/21/2021, MELD score was calculated at 11 at that time ? ?Pt will need DOS labs and evaluation.  ? ?EKG 06/11/21: NSR. Rate 68. ? ?Echo 04/12/2021: ?Sonographer Comments: Suboptimal apical window.  ?IMPRESSIONS  ? ? 1. Left ventricular ejection fraction, by estimation, is 50 to 55%. The  ?left ventricle has low normal function. The left ventricle has no regional  ?wall motion abnormalities. Left ventricular diastolic parameters are  ?indeterminate.  ? 2. Right ventricular systolic function is normal. The right ventricular  ?size is normal. Tricuspid regurgitation signal is inadequate for assessing  ?PA pressure.  ? 3. Left atrial size was mildly dilated.  ? 4. The mitral valve is normal in structure. Trivial mitral valve   ?regurgitation. No evidence of mitral stenosis.  ? 5. The aortic valve is grossly normal. Aortic valve regurgitation is not  ?visualized. No aortic stenosis is present.  ? ? ? ?Karoline Caldwell, PA-C ?[facility]/Anesthesiology ?Phone [phone] ?08/01/2021 2:25 PM ? ? ? ?

## 2021-08-02 ENCOUNTER — Encounter (HOSPITAL_COMMUNITY)
Admission: RE | Disposition: A | Discharge status: Home / Self Care | Payer: Self-pay | Source: Home / Self Care | Attending: Orthopaedic Surgery

## 2021-08-02 ENCOUNTER — Other Ambulatory Visit: Payer: Self-pay

## 2021-08-02 ENCOUNTER — Ambulatory Visit (HOSPITAL_COMMUNITY)
Admission: RE | Admit: 2021-08-02 | Discharge: 2021-08-02 | Disposition: A | Discharge status: Home / Self Care | Payer: Medicare Other | Attending: Orthopaedic Surgery | Admitting: Orthopaedic Surgery

## 2021-08-02 ENCOUNTER — Encounter (HOSPITAL_COMMUNITY): Payer: Self-pay | Admitting: Orthopaedic Surgery

## 2021-08-02 ENCOUNTER — Ambulatory Visit (HOSPITAL_COMMUNITY): Payer: Medicare Other | Admitting: Physician Assistant

## 2021-08-02 ENCOUNTER — Ambulatory Visit (HOSPITAL_COMMUNITY): Payer: Medicare Other

## 2021-08-02 ENCOUNTER — Ambulatory Visit (HOSPITAL_BASED_OUTPATIENT_CLINIC_OR_DEPARTMENT_OTHER): Payer: Medicare Other | Admitting: Physician Assistant

## 2021-08-02 DIAGNOSIS — S8261XA Displaced fracture of lateral malleolus of right fibula, initial encounter for closed fracture: Secondary | ICD-10-CM

## 2021-08-02 DIAGNOSIS — K754 Autoimmune hepatitis: Secondary | ICD-10-CM | POA: Diagnosis not present

## 2021-08-02 DIAGNOSIS — S93431A Sprain of tibiofibular ligament of right ankle, initial encounter: Secondary | ICD-10-CM | POA: Diagnosis not present

## 2021-08-02 DIAGNOSIS — F418 Other specified anxiety disorders: Secondary | ICD-10-CM | POA: Diagnosis not present

## 2021-08-02 DIAGNOSIS — K7460 Unspecified cirrhosis of liver: Secondary | ICD-10-CM | POA: Diagnosis not present

## 2021-08-02 DIAGNOSIS — G8918 Other acute postprocedural pain: Secondary | ICD-10-CM | POA: Diagnosis not present

## 2021-08-02 DIAGNOSIS — I4891 Unspecified atrial fibrillation: Secondary | ICD-10-CM

## 2021-08-02 DIAGNOSIS — K7290 Hepatic failure, unspecified without coma: Secondary | ICD-10-CM | POA: Diagnosis not present

## 2021-08-02 DIAGNOSIS — Z01818 Encounter for other preprocedural examination: Secondary | ICD-10-CM

## 2021-08-02 DIAGNOSIS — W19XXXA Unspecified fall, initial encounter: Secondary | ICD-10-CM | POA: Insufficient documentation

## 2021-08-02 DIAGNOSIS — E039 Hypothyroidism, unspecified: Secondary | ICD-10-CM | POA: Diagnosis not present

## 2021-08-02 DIAGNOSIS — S82841A Displaced bimalleolar fracture of right lower leg, initial encounter for closed fracture: Secondary | ICD-10-CM | POA: Diagnosis not present

## 2021-08-02 DIAGNOSIS — S82831A Other fracture of upper and lower end of right fibula, initial encounter for closed fracture: Secondary | ICD-10-CM | POA: Diagnosis not present

## 2021-08-02 HISTORY — PX: ORIF ANKLE FRACTURE: SHX5408

## 2021-08-02 HISTORY — DX: Dyspnea, unspecified: R06.00

## 2021-08-02 HISTORY — DX: Anemia, unspecified: D64.9

## 2021-08-02 HISTORY — DX: Sleep apnea, unspecified: G47.30

## 2021-08-02 HISTORY — DX: Hypothyroidism, unspecified: E03.9

## 2021-08-02 LAB — CBC
HCT: 33.8 % — ABNORMAL LOW (ref 36.0–46.0)
Hemoglobin: 11.1 g/dL — ABNORMAL LOW (ref 12.0–15.0)
MCH: 36.6 pg — ABNORMAL HIGH (ref 26.0–34.0)
MCHC: 32.8 g/dL (ref 30.0–36.0)
MCV: 111.6 fL — ABNORMAL HIGH (ref 80.0–100.0)
Platelets: UNDETERMINED 10*3/uL (ref 150–400)
RBC: 3.03 MIL/uL — ABNORMAL LOW (ref 3.87–5.11)
RDW: 15.9 % — ABNORMAL HIGH (ref 11.5–15.5)
WBC: 2.4 10*3/uL — ABNORMAL LOW (ref 4.0–10.5)
nRBC: 0 % (ref 0.0–0.2)

## 2021-08-02 LAB — COMPREHENSIVE METABOLIC PANEL
ALT: 21 U/L (ref 0–44)
AST: 42 U/L — ABNORMAL HIGH (ref 15–41)
Albumin: 2.7 g/dL — ABNORMAL LOW (ref 3.5–5.0)
Alkaline Phosphatase: 65 U/L (ref 38–126)
Anion gap: 7 (ref 5–15)
BUN: 7 mg/dL (ref 6–20)
CO2: 25 mmol/L (ref 22–32)
Calcium: 8.1 mg/dL — ABNORMAL LOW (ref 8.9–10.3)
Chloride: 107 mmol/L (ref 98–111)
Creatinine, Ser: 0.84 mg/dL (ref 0.44–1.00)
GFR, Estimated: >60 mL/min (ref >60)
Glucose, Bld: 91 mg/dL (ref 70–99)
Potassium: 3.8 mmol/L (ref 3.5–5.1)
Sodium: 139 mmol/L (ref 135–145)
Total Bilirubin: 2.7 mg/dL — ABNORMAL HIGH (ref 0.3–1.2)
Total Protein: 6 g/dL — ABNORMAL LOW (ref 6.5–8.1)

## 2021-08-02 LAB — PROTIME-INR
INR: 1.4 — ABNORMAL HIGH (ref 0.8–1.2)
Prothrombin Time: 16.9 seconds — ABNORMAL HIGH (ref 11.4–15.2)

## 2021-08-02 LAB — SURGICAL PCR SCREEN
MRSA, PCR: NEGATIVE
Staphylococcus aureus: NEGATIVE

## 2021-08-02 SURGERY — OPEN REDUCTION INTERNAL FIXATION (ORIF) ANKLE FRACTURE
Anesthesia: General | Site: Ankle | Laterality: Right

## 2021-08-02 MED ORDER — SUGAMMADEX SODIUM 200 MG/2ML IV SOLN
INTRAVENOUS | Status: DC | PRN
  Administered 2021-08-02: 200 mg via INTRAVENOUS

## 2021-08-02 MED ORDER — LIDOCAINE 2% (20 MG/ML) 5 ML SYRINGE
INTRAMUSCULAR | Status: AC
  Filled 2021-08-02: qty 5

## 2021-08-02 MED ORDER — ORAL CARE MOUTH RINSE: 15.0000 mL | Freq: Once | OROMUCOSAL | Status: AC

## 2021-08-02 MED ORDER — LIDOCAINE 2% (20 MG/ML) 5 ML SYRINGE
INTRAMUSCULAR | Status: DC | PRN
  Administered 2021-08-02: 80 mg via INTRAVENOUS

## 2021-08-02 MED ORDER — FENTANYL CITRATE (PF) 250 MCG/5ML IJ SOLN
INTRAMUSCULAR | Status: DC | PRN
  Administered 2021-08-02: 50 ug via INTRAVENOUS

## 2021-08-02 MED ORDER — ONDANSETRON HCL 4 MG/2ML IJ SOLN
INTRAMUSCULAR | Status: AC
  Filled 2021-08-02: qty 2

## 2021-08-02 MED ORDER — MIDAZOLAM HCL 2 MG/2ML IJ SOLN
INTRAMUSCULAR | Status: AC
  Administered 2021-08-02: 1 mg via INTRAVENOUS
  Filled 2021-08-02: qty 2

## 2021-08-02 MED ORDER — PHENYLEPHRINE 40 MCG/ML (10ML) SYRINGE FOR IV PUSH (FOR BLOOD PRESSURE SUPPORT)
PREFILLED_SYRINGE | INTRAVENOUS | Status: AC
  Filled 2021-08-02: qty 10

## 2021-08-02 MED ORDER — LACTATED RINGERS IV SOLN: INTRAVENOUS | Status: DC

## 2021-08-02 MED ORDER — ROCURONIUM BROMIDE 10 MG/ML (PF) SYRINGE
PREFILLED_SYRINGE | INTRAVENOUS | Status: AC
  Filled 2021-08-02: qty 10

## 2021-08-02 MED ORDER — ROCURONIUM BROMIDE 10 MG/ML (PF) SYRINGE
PREFILLED_SYRINGE | INTRAVENOUS | Status: DC | PRN
  Administered 2021-08-02: 25 mg via INTRAVENOUS
  Administered 2021-08-02: 55 mg via INTRAVENOUS

## 2021-08-02 MED ORDER — PHENYLEPHRINE HCL-NACL 20-0.9 MG/250ML-% IV SOLN
INTRAVENOUS | Status: DC | PRN
  Administered 2021-08-02: 40 ug/min via INTRAVENOUS

## 2021-08-02 MED ORDER — CEFAZOLIN IN SODIUM CHLORIDE 3-0.9 GM/100ML-% IV SOLN
INTRAVENOUS | Status: AC
  Filled 2021-08-02: qty 100

## 2021-08-02 MED ORDER — PROPOFOL 10 MG/ML IV BOLUS
INTRAVENOUS | Status: DC | PRN
  Administered 2021-08-02: 100 mg via INTRAVENOUS
  Administered 2021-08-02: 20 mg via INTRAVENOUS

## 2021-08-02 MED ORDER — FENTANYL CITRATE (PF) 100 MCG/2ML IJ SOLN: 50.0000 ug | Freq: Once | INTRAMUSCULAR | Status: AC

## 2021-08-02 MED ORDER — CHLORHEXIDINE GLUCONATE 0.12 % MT SOLN: 15.0000 mL | Freq: Once | OROMUCOSAL | Status: AC

## 2021-08-02 MED ORDER — ONDANSETRON HCL 4 MG/2ML IJ SOLN
INTRAMUSCULAR | Status: DC | PRN
  Administered 2021-08-02: 4 mg via INTRAVENOUS

## 2021-08-02 MED ORDER — MIDAZOLAM HCL 2 MG/2ML IJ SOLN: 1.0000 mg | Freq: Once | INTRAMUSCULAR | Status: AC

## 2021-08-02 MED ORDER — ONDANSETRON HCL 4 MG/2ML IJ SOLN: 4.0000 mg | Freq: Once | INTRAMUSCULAR | Status: DC | PRN

## 2021-08-02 MED ORDER — ALBUMIN HUMAN 5 % IV SOLN: INTRAVENOUS | Status: DC | PRN

## 2021-08-02 MED ORDER — LACTATED RINGERS IV SOLN: INTRAVENOUS | Status: DC | PRN

## 2021-08-02 MED ORDER — CEFAZOLIN IN SODIUM CHLORIDE 3-0.9 GM/100ML-% IV SOLN
3.0000 g | INTRAVENOUS | Status: AC
  Administered 2021-08-02: 3 g via INTRAVENOUS

## 2021-08-02 MED ORDER — PHENYLEPHRINE 40 MCG/ML (10ML) SYRINGE FOR IV PUSH (FOR BLOOD PRESSURE SUPPORT)
PREFILLED_SYRINGE | INTRAVENOUS | Status: DC | PRN
  Administered 2021-08-02: 80 ug via INTRAVENOUS
  Administered 2021-08-02 (×2): 120 ug via INTRAVENOUS
  Administered 2021-08-02: 80 ug via INTRAVENOUS

## 2021-08-02 MED ORDER — BUPIVACAINE-EPINEPHRINE (PF) 0.5% -1:200000 IJ SOLN
INTRAMUSCULAR | Status: DC | PRN
  Administered 2021-08-02: 15 mL via PERINEURAL
  Administered 2021-08-02: 25 mL via PERINEURAL

## 2021-08-02 MED ORDER — FENTANYL CITRATE (PF) 250 MCG/5ML IJ SOLN
INTRAMUSCULAR | Status: AC
Start: 2021-08-02 — End: ?
  Filled 2021-08-02: qty 5

## 2021-08-02 MED ORDER — SODIUM CHLORIDE 0.9 % IR SOLN
Status: DC | PRN
Start: 2021-08-02 — End: 2021-08-02
  Administered 2021-08-02: 1000 mL

## 2021-08-02 MED ORDER — DEXAMETHASONE SODIUM PHOSPHATE 10 MG/ML IJ SOLN
INTRAMUSCULAR | Status: AC
  Filled 2021-08-02: qty 1

## 2021-08-02 MED ORDER — PROPOFOL 10 MG/ML IV BOLUS
INTRAVENOUS | Status: AC
  Filled 2021-08-02: qty 20

## 2021-08-02 MED ORDER — VANCOMYCIN HCL 500 MG IV SOLR
INTRAVENOUS | Status: DC | PRN
  Administered 2021-08-02: 500 mg via TOPICAL

## 2021-08-02 MED ORDER — DEXAMETHASONE SODIUM PHOSPHATE 10 MG/ML IJ SOLN
INTRAMUSCULAR | Status: DC | PRN
  Administered 2021-08-02: 5 mg via INTRAVENOUS

## 2021-08-02 MED ORDER — CHLORHEXIDINE GLUCONATE 0.12 % MT SOLN
OROMUCOSAL | Status: AC
  Administered 2021-08-02: 15 mL via OROMUCOSAL
  Filled 2021-08-02: qty 15

## 2021-08-02 MED ORDER — FENTANYL CITRATE (PF) 100 MCG/2ML IJ SOLN
INTRAMUSCULAR | Status: AC
  Administered 2021-08-02: 50 ug via INTRAVENOUS
  Filled 2021-08-02: qty 2

## 2021-08-02 MED ORDER — CLONIDINE HCL (ANALGESIA) 100 MCG/ML EP SOLN
EPIDURAL | Status: DC | PRN
  Administered 2021-08-02: 100 ug

## 2021-08-02 MED ORDER — FENTANYL CITRATE (PF) 100 MCG/2ML IJ SOLN: 25.0000 ug | INTRAMUSCULAR | Status: DC | PRN

## 2021-08-02 MED ORDER — VANCOMYCIN HCL 500 MG IV SOLR
INTRAVENOUS | Status: AC
  Filled 2021-08-02: qty 10

## 2021-08-02 SURGICAL SUPPLY — 83 items
ANCHOR SUT KEITH ABD SZ2 STR (SUTURE) ×2 IMPLANT
BAG COUNTER SPONGE SURGICOUNT (BAG) IMPLANT
BANDAGE ESMARK 6X9 LF (GAUZE/BANDAGES/DRESSINGS) ×1 IMPLANT
BIT DRILL 2.5X2.75 QC CALB (BIT) ×1 IMPLANT
BIT DRILL CALIBRATED 2.7 (BIT) ×1 IMPLANT
BLADE 15 SAFETY STRL DISP (BLADE) ×4 IMPLANT
BLADE LONG MED 31X9 (MISCELLANEOUS) ×2 IMPLANT
BNDG COHESIVE 4X5 TAN ST LF (GAUZE/BANDAGES/DRESSINGS) ×2 IMPLANT
BNDG COHESIVE 6X5 TAN NS LF (GAUZE/BANDAGES/DRESSINGS) ×2 IMPLANT
BNDG COHESIVE 6X5 TAN ST LF (GAUZE/BANDAGES/DRESSINGS) ×2 IMPLANT
BNDG ELASTIC 4X5.8 VLCR NS LF (GAUZE/BANDAGES/DRESSINGS) ×2 IMPLANT
BNDG ELASTIC 4X5.8 VLCR STR LF (GAUZE/BANDAGES/DRESSINGS) ×2 IMPLANT
BNDG ELASTIC 6X5.8 VLCR STR LF (GAUZE/BANDAGES/DRESSINGS) ×2 IMPLANT
BNDG ESMARK 6X9 LF (GAUZE/BANDAGES/DRESSINGS) ×2
BNDG GAUZE ELAST 4 BULKY (GAUZE/BANDAGES/DRESSINGS) ×2 IMPLANT
CHLORAPREP W/TINT 26 (MISCELLANEOUS) ×4 IMPLANT
COVER SURGICAL LIGHT HANDLE (MISCELLANEOUS) ×2 IMPLANT
CUFF TOURN SGL QUICK 34 (TOURNIQUET CUFF) ×1
CUFF TRNQT CYL 34X4.125X (TOURNIQUET CUFF) ×1 IMPLANT
DECANTER SPIKE VIAL GLASS SM (MISCELLANEOUS) IMPLANT
DRAPE C-ARM 42X120 X-RAY (DRAPES) IMPLANT
DRAPE C-ARM MINI 42X72 WSTRAPS (DRAPES) ×2 IMPLANT
DRAPE C-ARMOR (DRAPES) ×2 IMPLANT
DRAPE EXTREMITY T 121X128X90 (DISPOSABLE) ×2 IMPLANT
DRAPE OEC MINIVIEW 54X84 (DRAPES) ×2 IMPLANT
DRAPE ORTHO SPLIT 77X108 STRL (DRAPES) ×2
DRAPE SURG ORHT 6 SPLT 77X108 (DRAPES) ×2 IMPLANT
DRAPE U-SHAPE 47X51 STRL (DRAPES) ×2 IMPLANT
DRSG ADAPTIC 3X8 NADH LF (GAUZE/BANDAGES/DRESSINGS) ×2 IMPLANT
DRSG PAD ABDOMINAL 8X10 ST (GAUZE/BANDAGES/DRESSINGS) ×10 IMPLANT
ELECT REM PT RETURN 15FT ADLT (MISCELLANEOUS) ×2 IMPLANT
FACESHIELD WRAPAROUND (MASK) ×2 IMPLANT
FACESHIELD WRAPAROUND OR TEAM (MASK) ×1 IMPLANT
FIXATION ZIPTIGHT ANKLE SNDSMS (Ankle) IMPLANT
GAUZE SPONGE 4X4 12PLY STRL (GAUZE/BANDAGES/DRESSINGS) ×4 IMPLANT
GAUZE XEROFORM 1X8 LF (GAUZE/BANDAGES/DRESSINGS) ×1 IMPLANT
GAUZE XEROFORM 5X9 LF (GAUZE/BANDAGES/DRESSINGS) ×2 IMPLANT
GLOVE SRG 8 PF TXTR STRL LF DI (GLOVE) ×2 IMPLANT
GLOVE SURG ENC MOIS LTX SZ7.5 (GLOVE) ×4 IMPLANT
GLOVE SURG MICRO LTX SZ7.5 (GLOVE) ×4 IMPLANT
GLOVE SURG POLYISO LF SZ7.5 (GLOVE) ×2 IMPLANT
GLOVE SURG UNDER POLY LF SZ7.5 (GLOVE) ×4 IMPLANT
GLOVE SURG UNDER POLY LF SZ8 (GLOVE) ×2
K-WIRE ACE 1.6X6 (WIRE) ×4
KIT BASIN OR (CUSTOM PROCEDURE TRAY) ×2 IMPLANT
KIT TURNOVER KIT A (KITS) IMPLANT
KWIRE ACE 1.6X6 (WIRE) IMPLANT
NDL MAYO CATGUT SZ4 TPR NDL (NEEDLE) IMPLANT
NEEDLE HYPO 22GX1.5 SAFETY (NEEDLE) ×2 IMPLANT
NEEDLE MAYO CATGUT SZ4 (NEEDLE) IMPLANT
NS IRRIG 1000ML POUR BTL (IV SOLUTION) ×2 IMPLANT
PACK ORTHO EXTREMITY (CUSTOM PROCEDURE TRAY) ×2 IMPLANT
PAD CAST 4YDX4 CTTN HI CHSV (CAST SUPPLIES) ×6 IMPLANT
PADDING CAST COTTON 4X4 STRL (CAST SUPPLIES) ×6
PADDING CAST COTTON 6X4 STRL (CAST SUPPLIES) ×2 IMPLANT
PLATE LOCK 3H 95 RT DIST FIB (Plate) ×1 IMPLANT
PROTECTOR NERVE ULNAR (MISCELLANEOUS) ×2 IMPLANT
SCREW LOCK 3.5X14 DIST TIB (Screw) ×1 IMPLANT
SCREW LOCK CORT STAR 3.5X12 (Screw) ×1 IMPLANT
SCREW LOCK CORT STAR 3.5X14 (Screw) ×2 IMPLANT
SCREW LOCK CORT STAR 3.5X16 (Screw) ×2 IMPLANT
SCREW NON LOCKING LP 3.5 14MM (Screw) ×2 IMPLANT
SPONGE T-LAP 18X18 ~~LOC~~+RFID (SPONGE) ×2 IMPLANT
STAPLER VISISTAT 35W (STAPLE) ×2 IMPLANT
STOCKINETTE TUBULAR 6 INCH (GAUZE/BANDAGES/DRESSINGS) ×2 IMPLANT
STOCKINETTE TUBULAR COTT 4X25 (GAUZE/BANDAGES/DRESSINGS) ×2 IMPLANT
STRIP CLOSURE SKIN 1/2X4 (GAUZE/BANDAGES/DRESSINGS) ×2 IMPLANT
SUCTION FRAZIER HANDLE 10FR (MISCELLANEOUS) ×1
SUCTION TUBE FRAZIER 10FR DISP (MISCELLANEOUS) ×1 IMPLANT
SUT ETHILON 3 0 PS 1 (SUTURE) ×2 IMPLANT
SUT MON AB 3-0 SH 27 (SUTURE) ×1
SUT MON AB 3-0 SH27 (SUTURE) ×1 IMPLANT
SUT VIC AB 2-0 CT1 27 (SUTURE) ×1
SUT VIC AB 2-0 CT1 TAPERPNT 27 (SUTURE) ×1 IMPLANT
SUT VIC AB 2-0 SH 27 (SUTURE) ×1
SUT VIC AB 2-0 SH 27XBRD (SUTURE) ×1 IMPLANT
SUT VIC AB 3-0 PS2 18 (SUTURE) ×1
SUT VIC AB 3-0 PS2 18XBRD (SUTURE) ×1 IMPLANT
SUT VIC AB 3-0 SH 27 (SUTURE) ×2
SUT VIC AB 3-0 SH 27X BRD (SUTURE) ×2 IMPLANT
SYR CONTROL 10ML LL (SYRINGE) ×2 IMPLANT
WATER STERILE IRR 1000ML POUR (IV SOLUTION) ×2 IMPLANT
ZIPTIGHT ANKLE SYNODESMOSS FIX (Ankle) ×2 IMPLANT

## 2021-08-02 NOTE — H&P (Addendum)
H&P Update: ? ?-History and Physical Reviewed ? ?-Patient has been re-examined ? ?-No change in the plan of care ? ?-The risks and benefits were presented and reviewed. The risks due to hardware failure/irritation, new/persistent infection, stiffness, nerve/vessel/tendon injury, nonunion/malunion, wound healing issues, development of arthritis, failure of this surgery, possibility of external fixation with delayed definitive surgery, need for further surgery, thromboembolic events, anesthesia/medical complications, amputation, death among others were discussed. The patient acknowledged the explanation, agreed to proceed with the plan and a consent was signed. ? ?Connie Larsen ? ?

## 2021-08-02 NOTE — Anesthesia Procedure Notes (Addendum)
Procedure Name: Intubation ?Date/Time: 08/02/2021 1:56 PM ?Performed by: Reece Agar, CRNA ?Pre-anesthesia Checklist: Patient identified, Emergency Drugs available, Suction available and Patient being monitored ?Patient Re-evaluated:Patient Re-evaluated prior to induction ?Oxygen Delivery Method: Circle System Utilized ?Preoxygenation: Pre-oxygenation with 100% oxygen ?Induction Type: IV induction ?Ventilation: Mask ventilation without difficulty and Oral airway inserted - appropriate to patient size ?Laryngoscope Size: Mac and 4 ?Grade View: Grade I ?Tube type: Oral ?Tube size: 7.5 mm ?Number of attempts: 1 ?Airway Equipment and Method: Stylet and Oral airway ?Placement Confirmation: ETT inserted through vocal cords under direct vision, positive ETCO2 and breath sounds checked- equal and bilateral ?Secured at: 22 cm ?Tube secured with: Tape ?Dental Injury: Teeth and Oropharynx as per pre-operative assessment  ? ? ? ? ?

## 2021-08-02 NOTE — Anesthesia Procedure Notes (Signed)
Anesthesia Regional Block: Adductor canal block  ? ?Pre-Anesthetic Checklist: , timeout performed,  Correct Patient, Correct Site, Correct Laterality,  Correct Procedure, Correct Position, site marked,  Risks and benefits discussed,  Surgical consent,  Pre-op evaluation,  At surgeon's request and post-op pain management ? ?Laterality: Right ? ?Prep: chloraprep     ?  ?Needles:  ?Injection technique: Single-shot ? ?Needle Type: Echogenic Needle   ? ? ?Needle Length: 9cm  ?Needle Gauge: 21  ? ? ? ?Additional Needles: ? ? ?Procedures:,,,, ultrasound used (permanent image in chart),,    ?Narrative:  ?Start time: 08/02/2021 12:24 PM ?End time: 08/02/2021 12:28 PM ?Injection made incrementally with aspirations every 5 mL. ? ?Performed by: Personally  ?Anesthesiologist: Santa Lighter, MD ? ?Additional Notes: ?No pain on injection. No increased resistance to injection. Injection made in 5cc increments.  Good needle visualization.  Patient tolerated procedure well. ? ? ? ? ?

## 2021-08-02 NOTE — Transfer of Care (Signed)
Immediate Anesthesia Transfer of Care Note ? ?Patient: Connie Larsen ? ?Procedure(s) Performed: OPEN REDUCTION INTERNAL FIXATION (ORIF) RIGHT DISTAL FIBULA, POSSIBLE SYNDESMOSIS (Right: Ankle) ? ?Patient Location: PACU ? ?Anesthesia Type:General ? ?Level of Consciousness: awake ? ?Airway & Oxygen Therapy: Patient Spontanous Breathing ? ?Post-op Assessment: Report given to RN and Post -op Vital signs reviewed and stable ? ?Post vital signs: Reviewed and stable ? ?Last Vitals:  ?Vitals Value Taken Time  ?BP    ?Temp    ?Pulse 71 08/02/21 1537  ?Resp 14 08/02/21 1537  ?SpO2 94 % 08/02/21 1537  ?Vitals shown include unvalidated device data. ? ?Last Pain:  ?Vitals:  ? 08/02/21 1230  ?TempSrc:   ?PainSc: 0-No pain  ?   ? ?  ? ?Complications: No notable events documented. ?

## 2021-08-02 NOTE — Progress Notes (Signed)
As per secure chat with Kathi Der, MD with GI Ok to use Lovenox 40 mg Q day for 3-6 weeks.  He Would recommend to check CBC in 2 weeks and 4 weeks . If it goes beyond , she may have to reach out to her GI / Liver doctor but I think it would be ok ?

## 2021-08-02 NOTE — Anesthesia Procedure Notes (Signed)
Anesthesia Regional Block: Popliteal block  ? ?Pre-Anesthetic Checklist: , timeout performed,  Correct Patient, Correct Site, Correct Laterality,  Correct Procedure, Correct Position, site marked,  Risks and benefits discussed,  Surgical consent,  Pre-op evaluation,  At surgeon's request and post-op pain management ? ?Laterality: Right ? ?Prep: chloraprep     ?  ?Needles:  ?Injection technique: Single-shot ? ?Needle Type: Echogenic Needle   ? ? ?Needle Length: 9cm  ?Needle Gauge: 21  ? ? ? ?Additional Needles: ? ? ?Procedures:,,,, ultrasound used (permanent image in chart),,    ?Narrative:  ?Start time: 08/02/2021 12:18 PM ?End time: 08/02/2021 12:24 PM ?Injection made incrementally with aspirations every 5 mL. ? ?Performed by: Personally  ?Anesthesiologist: Collene Schlichter, MD ? ?Additional Notes: ?No pain on injection. No increased resistance to injection. Injection made in 5cc increments.  Good needle visualization.  Patient tolerated procedure well. ? ? ? ? ?

## 2021-08-02 NOTE — Anesthesia Postprocedure Evaluation (Signed)
Anesthesia Post Note ? ?Patient: Connie Larsen ? ?Procedure(s) Performed: OPEN REDUCTION INTERNAL FIXATION (ORIF) RIGHT DISTAL FIBULA, POSSIBLE SYNDESMOSIS (Right: Ankle) ? ?  ? ?Patient location during evaluation: PACU ?Anesthesia Type: General ?Level of consciousness: awake and alert ?Pain management: pain level controlled ?Vital Signs Assessment: post-procedure vital signs reviewed and stable ?Respiratory status: spontaneous breathing, nonlabored ventilation, respiratory function stable and patient connected to nasal cannula oxygen ?Cardiovascular status: blood pressure returned to baseline and stable ?Postop Assessment: no apparent nausea or vomiting ?Anesthetic complications: no ? ? ?No notable events documented. ? ?Last Vitals:  ?Vitals:  ? 08/02/21 1554 08/02/21 1609  ?BP: (!) 111/48 116/60  ?Pulse: 70 65  ?Resp: 16 19  ?Temp:  36.4 ?C  ?SpO2: 97% 97%  ?  ?Last Pain:  ?Vitals:  ? 08/02/21 1609  ?TempSrc:   ?PainSc: 0-No pain  ? ? ?  ?  ?  ?  ?  ?  ? ?Collene Schlichter ? ? ? ? ?

## 2021-08-02 NOTE — Discharge Instructions (Addendum)
Armond Hang, MD ?[facility] ? ?Please read the following information regarding your care after surgery. ? ?Medications  ?You only need a prescription for the narcotic pain medicine (ex. oxycodone, Percocet, Norco).  All of the other medicines listed below are available over the counter. ?? Aleve 2 pills twice a day for the first 3 days after surgery. ?? acetominophen (Tylenol) 650 mg every 4-6 hours as you need for minor to moderate pain ?? oxycodone as prescribed for severe pain ? ?? To help prevent blood clots, take lovenox 40 mg once daily x 3 weeks after surgery.  You should also get up every hour while you are awake to move around. ? ?Weight Bearing ?? Do NOT bear any weight on the operated leg or foot. ? ?Cast / Splint / Dressing ?? If you have a splint, do NOT remove this. Keep your splint, cast or dressing clean and dry.  Don?t put anything (coat hanger, pencil, etc) down inside of it.  If it gets wet, call the office immediately to schedule an appointment for a cast change. ? ?Swelling ?IMPORTANT: It is normal for you to have swelling where you had surgery. To reduce swelling and pain, keep at least 3 pillows under your leg so that your toes are above your nose and your heel is above the level of your hip.  It may be necessary to keep your foot or leg elevated for several weeks.  This is critical to helping your incisions heal and your pain to feel better. ? ?Follow Up ?Call my office at [phone] when you are discharged from the hospital or surgery center to schedule an appointment to be seen 7-10 days after surgery. ? ?Call my office at [phone] if you develop a fever >101.5? F, nausea, vomiting, bleeding from the surgical site or severe pain.   ? ? ?

## 2021-08-03 NOTE — Op Note (Addendum)
08/02/2021 ? ?8:52 PM ? ? ?PATIENT: Connie Larsen  56 y.o. female ? ?MRN: [id number] ? ? ?PRE-OPERATIVE DIAGNOSIS:   ?Displaced bimalleolar fracture of right ankle ? ? ?POST-OPERATIVE DIAGNOSIS:   ?Same ? ? ?PROCEDURE: ?Right distal fibula open reduction internal fixation ?Right ankle syndesmosis open reduction internal fixation ? ? ?SURGEON:  Armond Hang, MD ? ? ?ASSISTANT: None ? ? ?ANESTHESIA: General, regional ? ? ?EBL: Minimal ? ? ?TOURNIQUET:   ? ?Total Tourniquet Time Documented: ?Thigh (Right) - 60 minutes ?Total: Thigh (Right) - 60 minutes ? ? ? ?COMPLICATIONS: None apparent ? ? ?DISPOSITION: Extubated, awake and stable to recovery. ? ? ?INDICATION FOR PROCEDURE: ?The patient presented with right bimalleolar ankle fracture. Of note, the patient has liver failure for which she obtained preoperative clearance from her hepatologist. We also obtained postop anticoagulation instructions from GI (Dr. Otis Brace) who advises Lovenox 40 mg subcutaneous once daily x 3-6 wk. PT-INR was drawn just prior to surgery and noted to be 1.4. ? ?We discussed the diagnosis, alternative treatment options, risks and benefits of the above surgical intervention, as well as alternative non-operative treatments. All questions/concerns were addressed and the patient/family demonstrated appropriate understanding of the diagnosis, the procedure, the postoperative course, and overall prognosis. The patient wished to proceed with surgical intervention and signed an informed surgical consent as such, in each others presence prior to surgery. ? ? ?PROCEDURE IN DETAIL: ?After preoperative consent was obtained and the correct operative site was identified, the patient was brought to the operating room supine on stretcher and transferred onto operating table. General anesthesia was induced. Preoperative antibiotics were administered. Surgical timeout was taken. The patient was then positioned supine with an ipsilateral hip bump.  The operative lower extremity was prepped and draped in standard sterile fashion with a tourniquet around the thigh. The extremity was exsanguinated and the tourniquet was inflated to 300 mmHg. ? ?A standard lateral incision was made over the distal fibula. Dissection was carried down to the level of the fibula and the fracture site identified. The superficial peroneal nerve was identified and protected throughout the procedure. The fibula was noted to be shortened with interposed periosteum. The fibula was brought out to length. The fibula fracture was debrided and the edges defined to achieve cortical read. Reduction maneuver was performed using pointed reduction forceps and lobster forceps. In this manner, the fibula length was restored and fracture reduced. A lag screw was not able to be placed due to the orientation of fracture lines and comminution. Due to poor bone quality and extensive comminution at the fracture site, it was decided to use an anatomic locking distal fibula plate. We then selected a Zimmer anatomic locking plate to match the anatomy of the distal fibula and placed it laterally. This was implanted under intraoperative fluoroscopy with a combination of distal locking screws and proximal cortical & locking screws. ? ?A manual external rotation stress radiograph was obtained and demonstrated widening of the ankle mortise. Given this intraoperative finding as well as preoperative subluxation, it was decided to reduce and fix the syndesmosis. Therefore a suture fixation system (ZipTight device) was implanted through the fibula plate in cannulated fashion to fix the syndesmosis. Anchor/button position was verified along anteromedial tibial cortex by fluoroscopy. A repeat stress radiograph showed complete stability of the ankle mortise to testing. ? ?The surgical sites were thoroughly irrigated. Vancomycin powder was applied. The tourniquet was deflated and hemostasis achieved. The deep layers were  closed using 2-0 vicryl and the  subcutaneous tissues closed using 3-0 vicryl. The skin was closed without tension using 3-0 nylon suture.  ?  ?The leg was cleaned with saline and sterile xeroform dressings with gauze were applied. A well padded bulky short leg splint was applied. The patient was awakened from anesthesia and transported to the recovery room in stable condition.  ? ? ?FOLLOW UP PLAN: ?-transfer to PACU, then home ?-strict NWB operative extremity, maximum elevation ?-maintain short leg splint until follow up ?-DVT Ppx: Lovenox 40 mg once daily x 3-6 weeks as per GI team instructions ?-follow up as outpatient in 7-10 days for wound check ?-sutures out in 2-3 weeks with exchange of short leg splint to short leg cast in outpatient office ? ? ?RADIOGRAPHS: ?AP, lateral, oblique and stress radiographs of the right ankle were obtained intraoperatively. These showed interval reduction and fixation of the fractures. Manual stress radiographs were taken and the ankle mortise and tibiofibular relationship were noted to be stable following fixation. All hardware is appropriately positioned and of the appropriate lengths. No other acute injuries are noted. ? ? ?Armond Hang ?Orthopaedic Surgery ?[facility] ? ? ?

## 2021-08-04 ENCOUNTER — Encounter (HOSPITAL_COMMUNITY): Payer: Self-pay | Admitting: Orthopaedic Surgery

## 2021-08-04 DIAGNOSIS — S82841D Displaced bimalleolar fracture of right lower leg, subsequent encounter for closed fracture with routine healing: Secondary | ICD-10-CM | POA: Diagnosis not present

## 2021-08-04 DIAGNOSIS — S93431D Sprain of tibiofibular ligament of right ankle, subsequent encounter: Secondary | ICD-10-CM | POA: Diagnosis not present

## 2021-08-08 DIAGNOSIS — S8261XA Displaced fracture of lateral malleolus of right fibula, initial encounter for closed fracture: Secondary | ICD-10-CM | POA: Diagnosis not present

## 2021-08-26 DIAGNOSIS — S8261XA Displaced fracture of lateral malleolus of right fibula, initial encounter for closed fracture: Secondary | ICD-10-CM | POA: Diagnosis not present

## 2021-09-05 DIAGNOSIS — S8261XA Displaced fracture of lateral malleolus of right fibula, initial encounter for closed fracture: Secondary | ICD-10-CM | POA: Diagnosis not present

## 2021-09-30 DIAGNOSIS — S8261XA Displaced fracture of lateral malleolus of right fibula, initial encounter for closed fracture: Secondary | ICD-10-CM | POA: Diagnosis not present

## 2021-10-28 DIAGNOSIS — S8261XA Displaced fracture of lateral malleolus of right fibula, initial encounter for closed fracture: Secondary | ICD-10-CM | POA: Diagnosis not present

## 2021-11-04 ENCOUNTER — Other Ambulatory Visit: Payer: Self-pay

## 2021-11-04 ENCOUNTER — Inpatient Hospital Stay (HOSPITAL_COMMUNITY)
Admission: EM | Admit: 2021-11-04 | Discharge: 2021-11-06 | DRG: 442 | Disposition: A | Discharge status: Home / Self Care | Payer: Medicare Other | Attending: Internal Medicine | Admitting: Internal Medicine

## 2021-11-04 ENCOUNTER — Emergency Department (HOSPITAL_COMMUNITY): Payer: Medicare Other

## 2021-11-04 ENCOUNTER — Encounter (HOSPITAL_COMMUNITY): Payer: Self-pay

## 2021-11-04 DIAGNOSIS — Z79890 Hormone replacement therapy (postmenopausal): Secondary | ICD-10-CM | POA: Diagnosis not present

## 2021-11-04 DIAGNOSIS — R112 Nausea with vomiting, unspecified: Secondary | ICD-10-CM

## 2021-11-04 DIAGNOSIS — D638 Anemia in other chronic diseases classified elsewhere: Secondary | ICD-10-CM | POA: Diagnosis not present

## 2021-11-04 DIAGNOSIS — R9431 Abnormal electrocardiogram [ECG] [EKG]: Secondary | ICD-10-CM | POA: Diagnosis not present

## 2021-11-04 DIAGNOSIS — G4733 Obstructive sleep apnea (adult) (pediatric): Secondary | ICD-10-CM | POA: Diagnosis present

## 2021-11-04 DIAGNOSIS — D7589 Other specified diseases of blood and blood-forming organs: Secondary | ICD-10-CM | POA: Diagnosis not present

## 2021-11-04 DIAGNOSIS — A419 Sepsis, unspecified organism: Secondary | ICD-10-CM | POA: Diagnosis not present

## 2021-11-04 DIAGNOSIS — Z6841 Body Mass Index (BMI) 40.0 and over, adult: Secondary | ICD-10-CM | POA: Diagnosis not present

## 2021-11-04 DIAGNOSIS — K754 Autoimmune hepatitis: Secondary | ICD-10-CM | POA: Diagnosis not present

## 2021-11-04 DIAGNOSIS — Z886 Allergy status to analgesic agent status: Secondary | ICD-10-CM | POA: Diagnosis not present

## 2021-11-04 DIAGNOSIS — Z885 Allergy status to narcotic agent status: Secondary | ICD-10-CM

## 2021-11-04 DIAGNOSIS — Z8249 Family history of ischemic heart disease and other diseases of the circulatory system: Secondary | ICD-10-CM | POA: Diagnosis not present

## 2021-11-04 DIAGNOSIS — E039 Hypothyroidism, unspecified: Secondary | ICD-10-CM | POA: Diagnosis present

## 2021-11-04 DIAGNOSIS — K7682 Hepatic encephalopathy: Secondary | ICD-10-CM | POA: Diagnosis not present

## 2021-11-04 DIAGNOSIS — Z8616 Personal history of COVID-19: Secondary | ICD-10-CM | POA: Diagnosis not present

## 2021-11-04 DIAGNOSIS — F4310 Post-traumatic stress disorder, unspecified: Secondary | ICD-10-CM | POA: Diagnosis present

## 2021-11-04 DIAGNOSIS — Z79899 Other long term (current) drug therapy: Secondary | ICD-10-CM | POA: Diagnosis not present

## 2021-11-04 DIAGNOSIS — I480 Paroxysmal atrial fibrillation: Secondary | ICD-10-CM | POA: Diagnosis present

## 2021-11-04 DIAGNOSIS — Z888 Allergy status to other drugs, medicaments and biological substances status: Secondary | ICD-10-CM | POA: Diagnosis not present

## 2021-11-04 DIAGNOSIS — K7290 Hepatic failure, unspecified without coma: Secondary | ICD-10-CM | POA: Diagnosis not present

## 2021-11-04 DIAGNOSIS — F418 Other specified anxiety disorders: Secondary | ICD-10-CM | POA: Diagnosis present

## 2021-11-04 DIAGNOSIS — E869 Volume depletion, unspecified: Secondary | ICD-10-CM | POA: Diagnosis present

## 2021-11-04 DIAGNOSIS — K7460 Unspecified cirrhosis of liver: Secondary | ICD-10-CM | POA: Diagnosis not present

## 2021-11-04 DIAGNOSIS — E876 Hypokalemia: Secondary | ICD-10-CM | POA: Diagnosis not present

## 2021-11-04 LAB — I-STAT CHEM 8, ED
BUN: 12 mg/dL (ref 6–20)
Calcium, Ion: 1.11 mmol/L — ABNORMAL LOW (ref 1.15–1.40)
Chloride: 104 mmol/L (ref 98–111)
Creatinine, Ser: 0.9 mg/dL (ref 0.44–1.00)
Glucose, Bld: 108 mg/dL — ABNORMAL HIGH (ref 70–99)
HCT: 33 % — ABNORMAL LOW (ref 36.0–46.0)
Hemoglobin: 11.2 g/dL — ABNORMAL LOW (ref 12.0–15.0)
Potassium: 3.2 mmol/L — ABNORMAL LOW (ref 3.5–5.1)
Sodium: 140 mmol/L (ref 135–145)
TCO2: 22 mmol/L (ref 22–32)

## 2021-11-04 LAB — I-STAT BETA HCG BLOOD, ED (MC, WL, AP ONLY): I-stat hCG, quantitative: <5 m[IU]/mL

## 2021-11-04 LAB — CBC WITH DIFFERENTIAL/PLATELET
Abs Immature Granulocytes: 0.02 K/uL (ref 0.00–0.07)
Basophils Absolute: 0 K/uL (ref 0.0–0.1)
Basophils Relative: 1 %
Eosinophils Absolute: 0.1 K/uL (ref 0.0–0.5)
Eosinophils Relative: 2 %
HCT: 35.5 % — ABNORMAL LOW (ref 36.0–46.0)
Hemoglobin: 12 g/dL (ref 12.0–15.0)
Immature Granulocytes: 1 %
Lymphocytes Relative: 17 %
Lymphs Abs: 0.6 K/uL — ABNORMAL LOW (ref 0.7–4.0)
MCH: 34.4 pg — ABNORMAL HIGH (ref 26.0–34.0)
MCHC: 33.8 g/dL (ref 30.0–36.0)
MCV: 101.7 fL — ABNORMAL HIGH (ref 80.0–100.0)
Monocytes Absolute: 0.3 K/uL (ref 0.1–1.0)
Monocytes Relative: 8 %
Neutro Abs: 2.6 K/uL (ref 1.7–7.7)
Neutrophils Relative %: 71 %
Platelets: 78 K/uL — ABNORMAL LOW (ref 150–400)
RBC: 3.49 MIL/uL — ABNORMAL LOW (ref 3.87–5.11)
RDW: 13.8 % (ref 11.5–15.5)
WBC: 3.6 K/uL — ABNORMAL LOW (ref 4.0–10.5)
nRBC: 0 % (ref 0.0–0.2)

## 2021-11-04 LAB — COMPREHENSIVE METABOLIC PANEL
ALT: 30 U/L (ref 0–44)
AST: 49 U/L — ABNORMAL HIGH (ref 15–41)
Albumin: 2.8 g/dL — ABNORMAL LOW (ref 3.5–5.0)
Alkaline Phosphatase: 71 U/L (ref 38–126)
Anion gap: 7 (ref 5–15)
BUN: 13 mg/dL (ref 6–20)
CO2: 24 mmol/L (ref 22–32)
Calcium: 8.3 mg/dL — ABNORMAL LOW (ref 8.9–10.3)
Chloride: 109 mmol/L (ref 98–111)
Creatinine, Ser: 0.96 mg/dL (ref 0.44–1.00)
GFR, Estimated: >60 mL/min (ref >60)
Glucose, Bld: 114 mg/dL — ABNORMAL HIGH (ref 70–99)
Potassium: 3.2 mmol/L — ABNORMAL LOW (ref 3.5–5.1)
Sodium: 140 mmol/L (ref 135–145)
Total Bilirubin: 2.5 mg/dL — ABNORMAL HIGH (ref 0.3–1.2)
Total Protein: 7 g/dL (ref 6.5–8.1)

## 2021-11-04 LAB — LACTIC ACID, PLASMA
Lactic Acid, Venous: 1.7 mmol/L (ref 0.5–1.9)
Lactic Acid, Venous: 1.8 mmol/L (ref 0.5–1.9)

## 2021-11-04 LAB — PROTIME-INR
INR: 1.4 — ABNORMAL HIGH (ref 0.8–1.2)
Prothrombin Time: 17 s — ABNORMAL HIGH (ref 11.4–15.2)

## 2021-11-04 LAB — APTT: aPTT: 37 seconds — ABNORMAL HIGH (ref 24–36)

## 2021-11-04 LAB — CBG MONITORING, ED: Glucose-Capillary: 103 mg/dL — ABNORMAL HIGH (ref 70–99)

## 2021-11-04 LAB — AMMONIA: Ammonia: 101 umol/L — ABNORMAL HIGH (ref 9–35)

## 2021-11-04 LAB — ETHANOL: Alcohol, Ethyl (B): <10 mg/dL

## 2021-11-04 MED ORDER — ALBUMIN HUMAN 25 % IV SOLN
25.0000 g | Freq: Four times a day (QID) | INTRAVENOUS | Status: AC
  Administered 2021-11-04 (×2): 25 g via INTRAVENOUS
  Filled 2021-11-04 (×2): qty 100

## 2021-11-04 MED ORDER — LACTULOSE 10 GM/15ML PO SOLN
30.0000 g | Freq: Once | ORAL | Status: AC
  Administered 2021-11-04: 30 g via ORAL
  Filled 2021-11-04: qty 60

## 2021-11-04 MED ORDER — METOCLOPRAMIDE HCL 5 MG/ML IJ SOLN
10.0000 mg | Freq: Four times a day (QID) | INTRAMUSCULAR | Status: DC
Start: 2021-11-04 — End: 2021-11-06
  Administered 2021-11-04 – 2021-11-06 (×7): 10 mg via INTRAVENOUS
  Filled 2021-11-04 (×7): qty 2

## 2021-11-04 MED ORDER — METOCLOPRAMIDE HCL 5 MG/ML IJ SOLN
10.0000 mg | Freq: Once | INTRAMUSCULAR | Status: AC
  Administered 2021-11-04: 10 mg via INTRAVENOUS
  Filled 2021-11-04: qty 2

## 2021-11-04 MED ORDER — ONDANSETRON HCL 4 MG/2ML IJ SOLN
4.0000 mg | Freq: Four times a day (QID) | INTRAMUSCULAR | Status: DC | PRN
  Administered 2021-11-06: 4 mg via INTRAVENOUS
  Filled 2021-11-04 (×2): qty 2

## 2021-11-04 MED ORDER — LACTULOSE 10 GM/15ML PO SOLN
30.0000 g | Freq: Three times a day (TID) | ORAL | Status: DC
  Administered 2021-11-04 (×2): 30 g via ORAL
  Filled 2021-11-04: qty 45
  Filled 2021-11-04: qty 60

## 2021-11-04 MED ORDER — LACTATED RINGERS IV BOLUS (SEPSIS)
1000.0000 mL | Freq: Once | INTRAVENOUS | Status: AC
  Administered 2021-11-04: 1000 mL via INTRAVENOUS

## 2021-11-04 MED ORDER — ONDANSETRON HCL 4 MG/2ML IJ SOLN
4.0000 mg | Freq: Once | INTRAMUSCULAR | Status: AC
  Administered 2021-11-04: 4 mg via INTRAVENOUS
  Filled 2021-11-04: qty 2

## 2021-11-04 MED ORDER — ONDANSETRON HCL 4 MG PO TABS: 4.0000 mg | ORAL_TABLET | Freq: Four times a day (QID) | ORAL | Status: DC | PRN

## 2021-11-04 MED ORDER — POTASSIUM CHLORIDE IN NACL 20-0.9 MEQ/L-% IV SOLN
INTRAVENOUS | Status: AC
  Filled 2021-11-04 (×3): qty 1000

## 2021-11-04 MED ORDER — DIPHENHYDRAMINE HCL 50 MG/ML IJ SOLN
25.0000 mg | Freq: Once | INTRAMUSCULAR | Status: AC
  Administered 2021-11-04: 25 mg via INTRAVENOUS
  Filled 2021-11-04: qty 1

## 2021-11-04 NOTE — ED Triage Notes (Signed)
Pt arrived via POV with family, per family pt has been confused, "unable to string words together" since at least 9pm last night. Unsure of exact onset. Pt able to follow commands, not verbalizing. Grip strengths equal. Per family, pt has been vomiting. States this is what happens when her ammonia is elevated.

## 2021-11-04 NOTE — ED Provider Notes (Signed)
Connie Larsen Provider Note   CSN: [id number] Arrival date & time: 11/04/21  2992     History  Chief Complaint  Patient presents with   Altered Mental Status    Connie Larsen is a 56 y.o. female.  56 yo F with a chief complaints of confusion.  Was noted to be a bit confused yesterday and was thought to have a fever.  I been throwing up which was thought to be due to an illness that was going around the family.  Woke up this morning and was a bit more confused than yesterday.  Unable to complete a sentence and so brought him to the emergency department for evaluation.  She denies any complaints currently.  Has trouble saying more than yes or no.  Family states this happens when her ammonia level gets high.  They feel like she has been compliant with her medications.  Level 5 caveat altered mental status.   Altered Mental Status      Home Medications Prior to Admission medications   Medication Sig Start Date End Date Taking? Authorizing Provider  acetaminophen (TYLENOL) 500 MG tablet Take 500-1,000 mg by mouth every 6 (six) hours as needed (for pain).   Yes [provider]  azaTHIOprine (IMURAN) 50 MG tablet Take 100 mg by mouth daily in the afternoon. 04/18/21  Yes [provider]  escitalopram (LEXAPRO) 20 MG tablet Take 20 mg by mouth daily in the afternoon. 10/19/20  Yes [provider]  ferrous sulfate 325 (65 FE) MG tablet Take 325 mg by mouth daily in the afternoon. 07/04/20  Yes [provider]  lactulose (CHRONULAC) 10 GM/15ML solution Take 45 mLs (30 g total) by mouth 3 (three) times daily. 04/15/21  Yes Burnadette Pop, MD  levOCARNitine (L-CARNITINE PO) Take 1 tablet by mouth daily in the afternoon.   Yes [provider]  levothyroxine (SYNTHROID) 100 MCG tablet Take 100 mcg by mouth daily at 6 (six) AM. 05/31/21  Yes [provider]  Menthol, Topical Analgesic, (ICY HOT EX) Apply 1  application. topically 4 (four) times daily as needed (pain.).   Yes [provider]  Misc Natural Products (IMMUNE FORMULA PO) Take 2 tablets by mouth daily in the afternoon. Immune Plus   Yes [provider]  Multiple Vitamin (MULTIVITAMIN WITH MINERALS) TABS tablet Take 2 tablets by mouth daily in the afternoon.   Yes [provider]  Polyethyl Glycol-Propyl Glycol (LUBRICANT EYE DROPS) 0.4-0.3 % SOLN Place 1-2 drops into both eyes 3 (three) times daily as needed (dry/irritated eyes).   Yes [provider]  primidone (MYSOLINE) 50 MG tablet Take 50 mg by mouth daily in the afternoon. 04/19/21  Yes [provider]  rifaximin (XIFAXAN) 550 MG TABS tablet Take 1 tablet (550 mg total) by mouth 2 (two) times daily. 04/15/21  Yes Burnadette Pop, MD  traZODone (DESYREL) 50 MG tablet Take 50 mg by mouth at bedtime.   Yes [provider]  Zinc 100 MG TABS Take 200 mg by mouth daily in the afternoon.   Yes [provider]      Allergies    Aspirin, Propoxyphene, Tramadol, Morphine and related, Tylenol [acetaminophen], and Prednisone    Review of Systems   Review of Systems  Physical Exam Updated Vital Signs BP (!) 128/58   Pulse 83   Temp 98 F (36.7 C) (Oral)   Resp 16   SpO2 97%  Physical Exam Vitals and nursing note  reviewed.  Constitutional:      General: She is not in acute distress.    Appearance: She is well-developed. She is obese. She is not diaphoretic.  HENT:     Head: Normocephalic and atraumatic.     Ears:     Comments: A punctate scratch along the inner ear canal along the left external ear canal.  TM is normal.  Dried blood noted in the canal. Eyes:     Pupils: Pupils are equal, round, and reactive to light.  Cardiovascular:     Rate and Rhythm: Normal rate and regular rhythm.     Heart sounds: No murmur heard.    No friction rub. No gallop.  Pulmonary:     Effort: Pulmonary effort is normal.     Breath  sounds: No wheezing or rales.  Abdominal:     General: There is no distension.     Palpations: Abdomen is soft.     Tenderness: There is no abdominal tenderness.  Musculoskeletal:        General: No tenderness.     Cervical back: Normal range of motion and neck supple.  Skin:    General: Skin is warm and dry.  Neurological:     Mental Status: She is alert. She is disoriented.     Comments: Asterixis able to answer yes or no questions follows commands  Psychiatric:        Behavior: Behavior normal.     ED Results / Procedures / Treatments   Labs (all labs ordered are listed, but only abnormal results are displayed) Labs Reviewed  COMPREHENSIVE METABOLIC PANEL - Abnormal; Notable for the following components:      Result Value   Potassium 3.2 (*)    Glucose, Bld 114 (*)    Calcium 8.3 (*)    Albumin 2.8 (*)    AST 49 (*)    Total Bilirubin 2.5 (*)    All other components within normal limits  CBC WITH DIFFERENTIAL/PLATELET - Abnormal; Notable for the following components:   WBC 3.6 (*)    RBC 3.49 (*)    HCT 35.5 (*)    MCV 101.7 (*)    MCH 34.4 (*)    Platelets 78 (*)    Lymphs Abs 0.6 (*)    All other components within normal limits  PROTIME-INR - Abnormal; Notable for the following components:   Prothrombin Time 17.0 (*)    INR 1.4 (*)    All other components within normal limits  APTT - Abnormal; Notable for the following components:   aPTT 37 (*)    All other components within normal limits  AMMONIA - Abnormal; Notable for the following components:   Ammonia 101 (*)    All other components within normal limits  CBG MONITORING, ED - Abnormal; Notable for the following components:   Glucose-Capillary 103 (*)    All other components within normal limits  I-STAT CHEM 8, ED - Abnormal; Notable for the following components:   Potassium 3.2 (*)    Glucose, Bld 108 (*)    Calcium, Ion 1.11 (*)    Hemoglobin 11.2 (*)    HCT 33.0 (*)    All other components within  normal limits  CULTURE, BLOOD (ROUTINE X 2)  CULTURE, BLOOD (ROUTINE X 2)  URINE CULTURE  LACTIC ACID, PLASMA  LACTIC ACID, PLASMA  ETHANOL  URINALYSIS, ROUTINE W REFLEX MICROSCOPIC  I-STAT BETA HCG BLOOD, ED ([facility], [facility], [facility] ONLY)    EKG EKG Interpretation  Date/Time:  Tuesday November 04 2021 09:16:30 EDT Ventricular Rate:  78 PR Interval:  168 QRS Duration: 101 QT Interval:  410 QTC Calculation: 467 R Axis:   10 Text Interpretation: Sinus rhythm no longer in afib Otherwise no significant change Confirmed by Melene Plan [phone]) on 11/04/2021 9:18:37 AM  Radiology DG Chest Port 1 View  Result Date: 11/04/2021 CLINICAL DATA:  Sepsis. EXAM: PORTABLE CHEST 1 VIEW COMPARISON:  March 22, 2021. FINDINGS: The heart size and mediastinal contours are within normal limits. Both lungs are clear. The visualized skeletal structures are unremarkable. IMPRESSION: No active disease. Electronically Signed   By: Lupita Raider M.D.   On: 11/04/2021 09:39    Procedures Procedures    Medications Ordered in ED Medications  0.9 % NaCl with KCl 20 mEq/ L  infusion ( Intravenous New Bag/Given 11/04/21 1224)  albumin human 25 % solution 25 g (has no administration in time range)  lactulose (CHRONULAC) 10 GM/15ML solution 30 g (has no administration in time range)  metoCLOPramide (REGLAN) injection 10 mg (has no administration in time range)  ondansetron (ZOFRAN) tablet 4 mg (has no administration in time range)    Or  ondansetron (ZOFRAN) injection 4 mg (has no administration in time range)  lactated ringers bolus 1,000 mL (0 mLs Intravenous Stopped 11/04/21 0957)  ondansetron (ZOFRAN) injection 4 mg (4 mg Intravenous Given 11/04/21 0930)  lactulose (CHRONULAC) 10 GM/15ML solution 30 g (30 g Oral Given 11/04/21 1049)  metoCLOPramide (REGLAN) injection 10 mg (10 mg Intravenous Given 11/04/21 1049)  diphenhydrAMINE (BENADRYL) injection 25 mg (25 mg Intravenous Given 11/04/21 1049)    ED Course/ Medical  Decision Making/ A&P                           Medical Decision Making Amount and/or Complexity of Data Reviewed Labs: ordered. Radiology: ordered. ECG/medicine tests: ordered.  Risk Prescription drug management. Decision regarding hospitalization.   56 yo F with a significant past medical history of autoimmune hepatitis comes in with a chief complaint of confusion.  Family tells me this is typical when she has a high ammonia level.  He also reported a possible GI illness with nausea vomiting and fevers.  Fever was subjective.  Not febrile orally here.  Will treat nausea.  Blood work.  Bolus of IV fluids.  Reassess.  On record review it appears that the patient's MELD score was low and so it was thought she would be unlikely to receive an organ offer.  Patient's ammonia level is elevated.  Mild hypokalemia.  Thrombocytopenia.  INR mildly elevated at 1.4.  Patient reassessed and is a bit better on exam.  Still quite confused.  Still nauseated.  Discussed with hospitalist for admission.  The patients results and plan were reviewed and discussed.   Any x-rays performed were independently reviewed by myself.   Differential diagnosis were considered with the presenting HPI.  Medications  0.9 % NaCl with KCl 20 mEq/ L  infusion ( Intravenous New Bag/Given 11/04/21 1224)  albumin human 25 % solution 25 g (has no administration in time range)  lactulose (CHRONULAC) 10 GM/15ML solution 30 g (has no administration in time range)  metoCLOPramide (REGLAN) injection 10 mg (has no administration in time range)  ondansetron (ZOFRAN) tablet 4 mg (has no administration in time range)    Or  ondansetron (ZOFRAN) injection 4 mg (has no administration in time range)  lactated ringers bolus 1,000 mL (0  mLs Intravenous Stopped 11/04/21 0957)  ondansetron (ZOFRAN) injection 4 mg (4 mg Intravenous Given 11/04/21 0930)  lactulose (CHRONULAC) 10 GM/15ML solution 30 g (30 g Oral Given 11/04/21 1049)   metoCLOPramide (REGLAN) injection 10 mg (10 mg Intravenous Given 11/04/21 1049)  diphenhydrAMINE (BENADRYL) injection 25 mg (25 mg Intravenous Given 11/04/21 1049)    Vitals:   11/04/21 0911 11/04/21 0915  BP: 138/79 (!) 128/58  Pulse: 84 83  Resp: 18 16  Temp: 98 F (36.7 C)   TempSrc: Oral   SpO2: 97% 97%    Final diagnoses:  Hepatic encephalopathy (HCC)  Nausea and vomiting in adult    Admission/ observation were discussed with the admitting physician, patient and/or family and they are comfortable with the plan.          Final Clinical Impression(s) / ED Diagnoses Final diagnoses:  Hepatic encephalopathy (HCC)  Nausea and vomiting in adult    Rx / DC Orders ED Discharge Orders     None         Melene Plan, DO 11/04/21 1251

## 2021-11-04 NOTE — H&P (Signed)
History and Physical    Patient: Connie Larsen VOZ:[id number] DOB: 1965-12-07 DOA: 11/04/2021 DOS: the patient was seen and examined on 11/04/2021 PCP: Ailene Ravel, MD  Patient coming from: Home  Chief Complaint:  Chief Complaint  Patient presents with   Altered Mental Status   HPI: Connie Larsen is a 56 y.o. female with medical history significant of chronic anemia, osteoarthritis, cirrhosis of the liver, autoimmune hepatitis, depression, PTSD, paroxysmal atrial fibrillation, history of COVID-19, sleep apnea, hypothyroid who is brought to the emergency department via private vehicle due to confusion, "unable to string words together" since yesterday evening.  She also has several episodes of emesis.  Clinical picture is similar to when she has hyperammonemia.  She is able to follow commands, but is currently unable to verbally communicate.  ED course: Initial vital signs were temperature 98 F, pulse 84, respiration 18, BP 1 3879 mmHg and O2 sat 97% on room air.  The patient received 1000 mL of LR bolus and 30 g of lactulose p.o. x1.  Lab work: CBC is her white count 3.6, hemoglobin 12.0 g/dL platelets 78.  Ammonia level was 101 mol/L.  Alcohol level was negative.  CMP with a potassium of 3.2 mmol/L, renal function and the rest of the electrolytes are normal after calcium correction.  Total protein 7.0, albumin 2.9 g/dL.  AST 49 and total bilirubin 2.5 mg/dL.  Her lactic acid was normal twice.   Review of Systems: As mentioned in the history of present illness. All other systems reviewed and are negative.  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 was a child   ORIF ANKLE FRACTURE Right 08/02/2021   Procedure: OPEN REDUCTION INTERNAL FIXATION (ORIF) RIGHT DISTAL FIBULA, POSSIBLE SYNDESMOSIS;  Surgeon: Netta Cedars, MD;  Location: [facility] OR;  Service: Orthopedics;  Laterality: Right;  120 wants to follow 2nd case   TONSILLECTOMY     Social History:  reports that she has never smoked. She has never used smokeless tobacco. She reports that she does not drink alcohol and does not use drugs.  Allergies  Allergen Reactions   Aspirin Hives   Propoxyphene Anaphylaxis   Tramadol Anaphylaxis   Morphine And Related Nausea And Vomiting   Tylenol [Acetaminophen] Other (See Comments)    Was told not to take due to liver   Prednisone Rash    Family History  Problem Relation Age of Onset   Heart disease Mother    Arrhythmia Father        pacemaker   Heart disease Sister    Arrhythmia Brother        pacemaker    Prior to Admission medications   Medication Sig Start Date End Date Taking? Authorizing Provider  acetaminophen (TYLENOL) 500 MG tablet Take 500-1,000 mg by mouth every 6 (six) hours as needed (for pain).   Yes [provider]  azaTHIOprine (IMURAN) 50 MG tablet Take 100 mg by mouth daily in the afternoon. 04/18/21  Yes [provider]  escitalopram (LEXAPRO) 20 MG tablet Take 20 mg by mouth daily in the afternoon. 10/19/20  Yes [provider]  ferrous sulfate 325 (65 FE) MG tablet Take 325 mg by mouth daily in the afternoon. 07/04/20  Yes [provider]  lactulose (CHRONULAC) 10 GM/15ML solution Take 45 mLs (30 g total) by mouth 3 (three) times daily. 04/15/21  Yes Burnadette Pop, MD  levOCARNitine (L-CARNITINE PO) Take 1 tablet by mouth daily in the afternoon.   Yes [provider]  levothyroxine (SYNTHROID) 100 MCG tablet Take 100 mcg by mouth daily at 6  (six) AM. 05/31/21  Yes [provider]  Menthol, Topical Analgesic, (ICY HOT EX) Apply 1 application. topically 4 (four) times daily as needed (pain.).   Yes [provider]  Misc Natural Products (IMMUNE FORMULA PO) Take 2 tablets by mouth daily in the afternoon. Immune Plus   Yes [provider]  Multiple Vitamin (MULTIVITAMIN WITH MINERALS) TABS tablet Take 2 tablets by mouth daily in the afternoon.   Yes [provider]  Polyethyl Glycol-Propyl Glycol (LUBRICANT EYE DROPS) 0.4-0.3 % SOLN Place 1-2 drops into both eyes 3 (three) times daily as needed (dry/irritated eyes).   Yes [provider]  primidone (MYSOLINE) 50 MG tablet Take 50 mg by mouth daily in the afternoon. 04/19/21  Yes [provider]  rifaximin (XIFAXAN) 550 MG TABS tablet Take 1 tablet (550 mg total) by mouth 2 (two) times daily. 04/15/21  Yes Burnadette Pop, MD  traZODone (DESYREL) 50 MG tablet Take 50 mg by mouth at bedtime.   Yes [provider]  Zinc 100 MG TABS Take 200 mg by mouth daily in the afternoon.   Yes [provider]    Physical Exam: Vitals:   11/04/21 0911 11/04/21 0915  BP: 138/79 (!) 128/58  Pulse: 84 83  Resp: 18 16  Temp: 98 F (36.7 C)   TempSrc: Oral   SpO2: 97% 97%   Physical Exam Vitals and nursing note reviewed.  Constitutional:      Appearance: Normal appearance. She is obese.  HENT:     Head: Normocephalic.     Mouth/Throat:     Mouth: Mucous membranes are dry.  Eyes:     General: Scleral icterus present.     Pupils: Pupils are equal, round, and reactive to light.  Neck:     Vascular: No JVD.  Cardiovascular:     Rate and Rhythm: Normal rate and regular rhythm.     Heart sounds: S1 normal and S2 normal. No murmur heard. Pulmonary:     Effort: Pulmonary effort is normal.     Breath sounds: Normal breath sounds. No wheezing, rhonchi or rales.  Abdominal:     General: Abdomen is protuberant. Bowel sounds are  normal.     Palpations: Abdomen is soft.     Tenderness: There is no abdominal tenderness. There is no right CVA tenderness, left CVA tenderness or guarding.     Comments: Obese with mild ascites.  Musculoskeletal:     Cervical back: Neck supple.     Right lower leg: 1+ Pitting Edema present.     Left lower leg: 1+ Pitting Edema present.  Skin:    General: Skin is warm and dry.  Neurological:     General: No focal deficit present.     Mental Status:  She is alert. She is disoriented and confused.  Psychiatric:        Attention and Perception: She is inattentive.     Comments: Unable to talk at the moment.  Follows commands though.   Data Reviewed:  There are no new results to review at this time.  Assessment and Plan: Principal Problem:   Acute hepatic encephalopathy (HCC) In the setting of:   Volume depletion Observation/telemetry. Currently NPO. Abdomen is soft/NT. No SBP suspicion. Begin NS plus KCl 20 at 125 mL/hr x 16 hr. Albumin 25 gr IVP q6 hr every 6 hrs.  Active Problems:   Cirrhosis of liver (HCC) Once more alert: Continue Imuran 50 mg p.o. in the afternoon. Continue levocarnitine 1 tablet in the afternoon. Continue rifaximin 550 mg p.o. twice daily.    Hypothyroidism Resume levothyroxine 100 mcg p.o. daily once more alert.    Depression with anxiety Continue escitalopram 20 mg p.o. daily. Continue trazodone 50 mg p.o. bedtime.    Paroxysmal atrial fibrillation (HCC) CHA?DS?-VASc Score of at least 1. At the moment, not a candidate for anticoagulation. Heart rate and rhythm are normal. No further episodes since acute COVID-19.     Advance Care Planning:   Code Status: Full Code   Consults:   Family Communication: I spoke to her son Casimiro Needle who was in the room.  Severity of Illness: The appropriate patient status for this patient is OBSERVATION. Observation status is judged to be reasonable and necessary in order to provide the required intensity  of service to ensure the patient's safety. The patient's presenting symptoms, physical exam findings, and initial radiographic and laboratory data in the context of their medical condition is felt to place them at decreased risk for further clinical deterioration. Furthermore, it is anticipated that the patient will be medically stable for discharge from the hospital within 2 midnights of admission.   Author: Bobette Mo, MD 11/04/2021 12:04 PM  For on call review [email or web address].   This document was prepared using Dragon voice recognition software and may contain some unintended transcription errors.

## 2021-11-05 DIAGNOSIS — Z8249 Family history of ischemic heart disease and other diseases of the circulatory system: Secondary | ICD-10-CM | POA: Diagnosis not present

## 2021-11-05 DIAGNOSIS — G4733 Obstructive sleep apnea (adult) (pediatric): Secondary | ICD-10-CM | POA: Diagnosis present

## 2021-11-05 DIAGNOSIS — Z888 Allergy status to other drugs, medicaments and biological substances status: Secondary | ICD-10-CM | POA: Diagnosis not present

## 2021-11-05 DIAGNOSIS — F4310 Post-traumatic stress disorder, unspecified: Secondary | ICD-10-CM | POA: Diagnosis present

## 2021-11-05 DIAGNOSIS — D638 Anemia in other chronic diseases classified elsewhere: Secondary | ICD-10-CM | POA: Diagnosis present

## 2021-11-05 DIAGNOSIS — D7589 Other specified diseases of blood and blood-forming organs: Secondary | ICD-10-CM | POA: Diagnosis present

## 2021-11-05 DIAGNOSIS — Z6841 Body Mass Index (BMI) 40.0 and over, adult: Secondary | ICD-10-CM | POA: Diagnosis not present

## 2021-11-05 DIAGNOSIS — K7682 Hepatic encephalopathy: Secondary | ICD-10-CM | POA: Diagnosis present

## 2021-11-05 DIAGNOSIS — Z79890 Hormone replacement therapy (postmenopausal): Secondary | ICD-10-CM | POA: Diagnosis not present

## 2021-11-05 DIAGNOSIS — Z885 Allergy status to narcotic agent status: Secondary | ICD-10-CM | POA: Diagnosis not present

## 2021-11-05 DIAGNOSIS — I480 Paroxysmal atrial fibrillation: Secondary | ICD-10-CM | POA: Diagnosis present

## 2021-11-05 DIAGNOSIS — Z886 Allergy status to analgesic agent status: Secondary | ICD-10-CM | POA: Diagnosis not present

## 2021-11-05 DIAGNOSIS — Z8616 Personal history of COVID-19: Secondary | ICD-10-CM | POA: Diagnosis not present

## 2021-11-05 DIAGNOSIS — E039 Hypothyroidism, unspecified: Secondary | ICD-10-CM | POA: Diagnosis present

## 2021-11-05 DIAGNOSIS — K7460 Unspecified cirrhosis of liver: Secondary | ICD-10-CM | POA: Diagnosis present

## 2021-11-05 DIAGNOSIS — Z79899 Other long term (current) drug therapy: Secondary | ICD-10-CM | POA: Diagnosis not present

## 2021-11-05 DIAGNOSIS — F418 Other specified anxiety disorders: Secondary | ICD-10-CM | POA: Diagnosis present

## 2021-11-05 DIAGNOSIS — K754 Autoimmune hepatitis: Secondary | ICD-10-CM | POA: Diagnosis present

## 2021-11-05 DIAGNOSIS — E876 Hypokalemia: Secondary | ICD-10-CM | POA: Diagnosis not present

## 2021-11-05 MED ORDER — ACETAMINOPHEN 325 MG PO TABS: 650.0000 mg | ORAL_TABLET | Freq: Four times a day (QID) | ORAL | Status: DC | PRN

## 2021-11-05 MED ORDER — ESCITALOPRAM OXALATE 20 MG PO TABS
20.0000 mg | ORAL_TABLET | Freq: Every day | ORAL | Status: DC
  Administered 2021-11-05: 20 mg via ORAL
  Filled 2021-11-05: qty 1

## 2021-11-05 MED ORDER — POTASSIUM CHLORIDE 10 MEQ/100ML IV SOLN
10.0000 meq | INTRAVENOUS | Status: DC
  Administered 2021-11-05: 10 meq via INTRAVENOUS
  Filled 2021-11-05: qty 100

## 2021-11-05 MED ORDER — METOPROLOL TARTRATE 5 MG/5ML IV SOLN: 5.0000 mg | INTRAVENOUS | Status: DC | PRN

## 2021-11-05 MED ORDER — POTASSIUM CHLORIDE 10 MEQ/100ML IV SOLN: 10.0000 meq | INTRAVENOUS | Status: DC

## 2021-11-05 MED ORDER — POTASSIUM CHLORIDE CRYS ER 20 MEQ PO TBCR
20.0000 meq | EXTENDED_RELEASE_TABLET | Freq: Two times a day (BID) | ORAL | Status: DC
  Filled 2021-11-05: qty 1

## 2021-11-05 MED ORDER — FERROUS SULFATE 325 (65 FE) MG PO TABS
325.0000 mg | ORAL_TABLET | Freq: Every day | ORAL | Status: DC
  Administered 2021-11-05: 325 mg via ORAL
  Filled 2021-11-05: qty 1

## 2021-11-05 MED ORDER — IPRATROPIUM-ALBUTEROL 0.5-2.5 (3) MG/3ML IN SOLN: 3.0000 mL | RESPIRATORY_TRACT | Status: DC | PRN

## 2021-11-05 MED ORDER — POTASSIUM CHLORIDE CRYS ER 20 MEQ PO TBCR
40.0000 meq | EXTENDED_RELEASE_TABLET | Freq: Once | ORAL | Status: AC
  Administered 2021-11-05: 40 meq via ORAL
  Filled 2021-11-05: qty 2

## 2021-11-05 MED ORDER — LACTULOSE 10 GM/15ML PO SOLN
30.0000 g | Freq: Four times a day (QID) | ORAL | Status: DC
  Administered 2021-11-05 – 2021-11-06 (×5): 30 g via ORAL
  Filled 2021-11-05 (×5): qty 45

## 2021-11-05 MED ORDER — HYDRALAZINE HCL 20 MG/ML IJ SOLN: 10.0000 mg | INTRAMUSCULAR | Status: DC | PRN

## 2021-11-05 MED ORDER — TRAZODONE HCL 50 MG PO TABS
50.0000 mg | ORAL_TABLET | Freq: Every day | ORAL | Status: DC
  Administered 2021-11-05: 50 mg via ORAL
  Filled 2021-11-05: qty 1

## 2021-11-05 MED ORDER — AZATHIOPRINE 50 MG PO TABS
100.0000 mg | ORAL_TABLET | Freq: Every day | ORAL | Status: DC
  Administered 2021-11-05: 100 mg via ORAL
  Filled 2021-11-05 (×2): qty 2

## 2021-11-05 MED ORDER — RIFAXIMIN 550 MG PO TABS
550.0000 mg | ORAL_TABLET | Freq: Two times a day (BID) | ORAL | Status: DC
  Administered 2021-11-05 – 2021-11-06 (×3): 550 mg via ORAL
  Filled 2021-11-05 (×3): qty 1

## 2021-11-05 MED ORDER — POTASSIUM CHLORIDE CRYS ER 20 MEQ PO TBCR
20.0000 meq | EXTENDED_RELEASE_TABLET | Freq: Once | ORAL | Status: AC
Start: 2021-11-05 — End: 2021-11-05
  Administered 2021-11-05: 20 meq via ORAL
  Filled 2021-11-05: qty 1

## 2021-11-05 MED ORDER — PRIMIDONE 50 MG PO TABS
50.0000 mg | ORAL_TABLET | Freq: Every day | ORAL | Status: DC
  Administered 2021-11-05: 50 mg via ORAL
  Filled 2021-11-05 (×2): qty 1

## 2021-11-05 MED ORDER — DIPHENHYDRAMINE-ZINC ACETATE 2-0.1 % EX CREA
TOPICAL_CREAM | Freq: Three times a day (TID) | CUTANEOUS | Status: DC | PRN
Start: 2021-11-05 — End: 2021-11-06
  Filled 2021-11-05: qty 28

## 2021-11-05 MED ORDER — LEVOTHYROXINE SODIUM 100 MCG PO TABS
100.0000 ug | ORAL_TABLET | Freq: Every day | ORAL | Status: DC
Start: 2021-11-05 — End: 2021-11-06
  Administered 2021-11-05 – 2021-11-06 (×2): 100 ug via ORAL
  Filled 2021-11-05 (×2): qty 1

## 2021-11-05 MED ORDER — SENNOSIDES-DOCUSATE SODIUM 8.6-50 MG PO TABS: 1.0000 | ORAL_TABLET | Freq: Every evening | ORAL | Status: DC | PRN

## 2021-11-05 MED ORDER — GUAIFENESIN 100 MG/5ML PO LIQD: 5.0000 mL | ORAL | Status: DC | PRN

## 2021-11-05 NOTE — Care Management Obs Status (Signed)
MEDICARE OBSERVATION STATUS NOTIFICATION   Patient Details  Name: Connie Larsen MRN: [id number] Date of Birth: [birth year]   Medicare Observation Status Notification Given:  Yes    MahabirOlegario Messier, RN 11/05/2021, 11:50 AM

## 2021-11-05 NOTE — Progress Notes (Signed)
PROGRESS NOTE    Connie Larsen  SHF:[id number] DOB: 06/08/1965 DOA: 11/04/2021 PCP: Ailene Ravel, MD   Brief Narrative:  56 year old with history of chronic anemia, OA, liver cirrhosis, autoimmune hepatitis, depression, PTSD, paroxysmal A-fib, OSA, hypothyroidism brought to the hospital for evaluation of confusion.  Work-up showed elevated ammonia levels.   Assessment & Plan:  Principal Problem:   Hepatic encephalopathy (HCC) Active Problems:   Cirrhosis of liver (HCC)   Hypothyroidism   Depression with anxiety   Paroxysmal atrial fibrillation (HCC)   Volume depletion    Acute hepatic encephalopathy History of decompensated liver cirrhosis Autoimmune hepatitis - Admission ammonia level 101.  Started on lactulose.  Monitor mental status. - We will continue home rifaximin if patient is able to tolerate oral - On azathioprine  Hypokalemia - Repletion  Anemia of chronic disease with macrocytosis - Suspect from underlying cirrhosis.  Iron supplements  Hypothyroidism - Continue Synthroid  Depression with anxiety - Continue Lexapro and trazodone  Paroxysmal atrial fibrillation - Currently rate controlled.  Not on anticoagulation.  Low CHA2DS2-VASc - 1       DVT prophylaxis: SCDs Start: 11/04/21 1201 Code Status: Full COde Family Communication:    Patient is still slightly encephalopathy but improved after getting lactulose.  Maintain hospital stay for at least next 24 hours.         Body mass index is 45.96 kg/m.         Subjective: Mentation is slightly improved this morning after receiving lactulose and having bowel movement.  Overall still feels weak.  Sluggish in her responses.   Examination:  General exam: Appears calm and comfortable  Respiratory system: Clear to auscultation. Respiratory effort normal. Cardiovascular system: S1 & S2 heard, RRR. No JVD, murmurs, rubs, gallops or clicks. No pedal edema. Gastrointestinal system: Abdomen  is nondistended, soft and nontender. No organomegaly or masses felt. Normal bowel sounds heard. Central nervous system: Alert and oriented to name and place.  Baseline AAO X3. No focal neurological deficits. Extremities: Symmetric 5 x 5 power. Skin: No rashes, lesions or ulcers Psychiatry: Poor judgment and insight.  Objective: Vitals:   11/04/21 1941 11/04/21 1943 11/04/21 2328 11/05/21 0340  BP: 123/67 123/67 (!) 115/56 (!) 136/58  Pulse: (!) 129 (!) 129 62 62  Resp: 18 13 13 14   Temp: 97.8 F (36.6 C) 97.8 F (36.6 C) 98.1 F (36.7 C) 98.2 F (36.8 C)  TempSrc: Oral Oral    SpO2: 100% 100% 99% 98%  Weight: 133.1 kg     Height: 5\' 7"  (1.702 m)       Intake/Output Summary (Last 24 hours) at 11/05/2021 0848 Last data filed at 11/05/2021 0300 Gross per 24 hour  Intake 750 ml  Output --  Net 750 ml   Filed Weights   11/04/21 1941  Weight: 133.1 kg     Data Reviewed:   CBC: Recent Labs  Lab 11/04/21 0918 11/04/21 0941  WBC 3.6*  --   NEUTROABS 2.6  --   HGB 12.0 11.2*  HCT 35.5* 33.0*  MCV 101.7*  --   PLT 78*  --    Basic Metabolic Panel: Recent Labs  Lab 11/04/21 0918 11/04/21 0941  NA 140 140  K 3.2* 3.2*  CL 109 104  CO2 24  --   GLUCOSE 114* 108*  BUN 13 12  CREATININE 0.96 0.90  CALCIUM 8.3*  --    GFR: Estimated Creatinine Clearance: 101.8 mL/min (by C-G formula based on SCr of  0.9 mg/dL). Liver Function Tests: Recent Labs  Lab 11/04/21 0918  AST 49*  ALT 30  ALKPHOS 71  BILITOT 2.5*  PROT 7.0  ALBUMIN 2.8*   No results for input(s): "LIPASE", "AMYLASE" in the last 168 hours. Recent Labs  Lab 11/04/21 0918  AMMONIA 101*   Coagulation Profile: Recent Labs  Lab 11/04/21 0918  INR 1.4*   Cardiac Enzymes: No results for input(s): "CKTOTAL", "CKMB", "CKMBINDEX", "TROPONINI" in the last 168 hours. BNP (last 3 results) No results for input(s): "PROBNP" in the last 8760 hours. HbA1C: No results for input(s): "HGBA1C" in the last  72 hours. CBG: Recent Labs  Lab 11/04/21 0910  GLUCAP 103*   Lipid Profile: No results for input(s): "CHOL", "HDL", "LDLCALC", "TRIG", "CHOLHDL", "LDLDIRECT" in the last 72 hours. Thyroid Function Tests: No results for input(s): "TSH", "T4TOTAL", "FREET4", "T3FREE", "THYROIDAB" in the last 72 hours. Anemia Panel: No results for input(s): "VITAMINB12", "FOLATE", "FERRITIN", "TIBC", "IRON", "RETICCTPCT" in the last 72 hours. Sepsis Labs: Recent Labs  Lab 11/04/21 0918 11/04/21 1118  LATICACIDVEN 1.7 1.8    Recent Results (from the past 240 hour(s))  Blood Culture (routine x 2)     Status: None (Preliminary result)   Collection Time: 11/04/21  9:18 AM   Specimen: BLOOD  Result Value Ref Range Status   Specimen Description   Final    BLOOD BLOOD RIGHT ARM Performed at [facility], [address] [place], [place] [address]    Special Requests   Final    BOTTLES DRAWN AEROBIC AND ANAEROBIC Blood Culture results may not be optimal due to an excessive volume of blood received in culture bottles Performed at [facility], [address] [place], [place] [address]    Culture   Final    NO GROWTH < 24 HOURS Performed at [facility], [address], [place], [place] [address]    Report Status PENDING  Incomplete  Blood Culture (routine x 2)     Status: None (Preliminary result)   Collection Time: 11/04/21  9:40 AM   Specimen: BLOOD  Result Value Ref Range Status   Specimen Description   Final    BLOOD RIGHT ANTECUBITAL Performed at [facility], [address] [place], [place] [address]    Special Requests   Final    BOTTLES DRAWN AEROBIC AND ANAEROBIC Blood Culture results may not be optimal due to an excessive volume of blood received in culture bottles Performed at [facility], [address] [place], [place] [address]    Culture   Final    NO GROWTH < 24 HOURS Performed at [facility], [address] [place], [place] [address]    Report Status PENDING  Incomplete         Radiology Studies: DG Chest Port 1 View  Result Date: 11/04/2021 CLINICAL DATA:  Sepsis. EXAM: PORTABLE CHEST 1 VIEW COMPARISON:  March 22, 2021. FINDINGS: The heart size and mediastinal contours are within normal limits. Both lungs are clear. The visualized skeletal structures are unremarkable. IMPRESSION: No active disease. Electronically Signed   By: Lupita Raider M.D.   On: 11/04/2021 09:39        Scheduled Meds:  lactulose  30 g Oral TID   metoCLOPramide (REGLAN) injection  10 mg Intravenous Q6H   Continuous Infusions:   LOS: 0 days   Time spent= 35 mins    Bitania Shankland Joline Maxcy, MD [facility]  If 7PM-7AM, please contact night-coverage  11/05/2021, 8:48 AM

## 2021-11-05 NOTE — Progress Notes (Signed)
  Transition of Care [facility]) Screening Note   Patient Details  Name: Renelda Kilian Date of Birth: Apr 22, 1966   Transition of Care [facility]) CM/SW Contact:    Lanier Clam, RN Phone Number: 11/05/2021, 11:48 AM    Transition of Care Department [facility]) has reviewed patient and no TOC needs have been identified at this time. We will continue to monitor patient advancement through interdisciplinary progression rounds. If new patient transition needs arise, please place a TOC consult.

## 2021-11-06 DIAGNOSIS — K7682 Hepatic encephalopathy: Secondary | ICD-10-CM | POA: Diagnosis not present

## 2021-11-06 LAB — CBC
HCT: 30.3 % — ABNORMAL LOW (ref 36.0–46.0)
Hemoglobin: 10.1 g/dL — ABNORMAL LOW (ref 12.0–15.0)
MCH: 34.1 pg — ABNORMAL HIGH (ref 26.0–34.0)
MCHC: 33.3 g/dL (ref 30.0–36.0)
MCV: 102.4 fL — ABNORMAL HIGH (ref 80.0–100.0)
Platelets: 75 10*3/uL — ABNORMAL LOW (ref 150–400)
RBC: 2.96 MIL/uL — ABNORMAL LOW (ref 3.87–5.11)
RDW: 13.7 % (ref 11.5–15.5)
WBC: 3.1 10*3/uL — ABNORMAL LOW (ref 4.0–10.5)
nRBC: 0 % (ref 0.0–0.2)

## 2021-11-06 LAB — COMPREHENSIVE METABOLIC PANEL
ALT: 26 U/L (ref 0–44)
AST: 44 U/L — ABNORMAL HIGH (ref 15–41)
Albumin: 2.6 g/dL — ABNORMAL LOW (ref 3.5–5.0)
Alkaline Phosphatase: 53 U/L (ref 38–126)
Anion gap: 4 — ABNORMAL LOW (ref 5–15)
BUN: 10 mg/dL (ref 6–20)
CO2: 21 mmol/L — ABNORMAL LOW (ref 22–32)
Calcium: 8.4 mg/dL — ABNORMAL LOW (ref 8.9–10.3)
Chloride: 113 mmol/L — ABNORMAL HIGH (ref 98–111)
Creatinine, Ser: 0.74 mg/dL (ref 0.44–1.00)
GFR, Estimated: >60 mL/min (ref >60)
Glucose, Bld: 90 mg/dL (ref 70–99)
Potassium: 3.6 mmol/L (ref 3.5–5.1)
Sodium: 138 mmol/L (ref 135–145)
Total Bilirubin: 1.8 mg/dL — ABNORMAL HIGH (ref 0.3–1.2)
Total Protein: 5.6 g/dL — ABNORMAL LOW (ref 6.5–8.1)

## 2021-11-06 LAB — AMMONIA: Ammonia: 48 umol/L — ABNORMAL HIGH (ref 9–35)

## 2021-11-06 LAB — MAGNESIUM: Magnesium: 1.9 mg/dL (ref 1.7–2.4)

## 2021-11-06 MED ORDER — ONDANSETRON 4 MG PO TBDP: 4.0000 mg | ORAL_TABLET | Freq: Three times a day (TID) | ORAL | 0 refills | Status: DC | PRN

## 2021-11-06 MED ORDER — LACTULOSE 10 GM/15ML PO SOLN: 30.0000 g | Freq: Three times a day (TID) | ORAL | 3 refills | Status: AC

## 2021-11-09 LAB — CULTURE, BLOOD (ROUTINE X 2)
Culture: NO GROWTH
Culture: NO GROWTH

## 2021-11-20 ENCOUNTER — Other Ambulatory Visit: Payer: Self-pay | Admitting: Nurse Practitioner

## 2021-11-20 DIAGNOSIS — K754 Autoimmune hepatitis: Secondary | ICD-10-CM | POA: Diagnosis not present

## 2021-11-20 DIAGNOSIS — K766 Portal hypertension: Secondary | ICD-10-CM | POA: Diagnosis not present

## 2021-11-20 DIAGNOSIS — K7469 Other cirrhosis of liver: Secondary | ICD-10-CM

## 2021-11-20 DIAGNOSIS — K7682 Hepatic encephalopathy: Secondary | ICD-10-CM | POA: Diagnosis not present

## 2021-11-27 ENCOUNTER — Other Ambulatory Visit: Payer: Medicare Other

## 2021-12-23 DIAGNOSIS — E039 Hypothyroidism, unspecified: Secondary | ICD-10-CM | POA: Diagnosis not present

## 2021-12-23 DIAGNOSIS — I4891 Unspecified atrial fibrillation: Secondary | ICD-10-CM | POA: Diagnosis not present

## 2021-12-23 DIAGNOSIS — Z1231 Encounter for screening mammogram for malignant neoplasm of breast: Secondary | ICD-10-CM | POA: Diagnosis not present

## 2021-12-23 DIAGNOSIS — D649 Anemia, unspecified: Secondary | ICD-10-CM | POA: Diagnosis not present

## 2021-12-23 DIAGNOSIS — K754 Autoimmune hepatitis: Secondary | ICD-10-CM | POA: Diagnosis not present

## 2021-12-23 DIAGNOSIS — K7460 Unspecified cirrhosis of liver: Secondary | ICD-10-CM | POA: Diagnosis not present

## 2021-12-23 DIAGNOSIS — G4700 Insomnia, unspecified: Secondary | ICD-10-CM | POA: Diagnosis not present

## 2021-12-23 DIAGNOSIS — G250 Essential tremor: Secondary | ICD-10-CM | POA: Diagnosis not present

## 2021-12-24 ENCOUNTER — Other Ambulatory Visit: Payer: Self-pay | Admitting: Family Medicine

## 2021-12-24 DIAGNOSIS — Z1231 Encounter for screening mammogram for malignant neoplasm of breast: Secondary | ICD-10-CM

## 2021-12-24 DIAGNOSIS — E2839 Other primary ovarian failure: Secondary | ICD-10-CM

## 2021-12-30 ENCOUNTER — Encounter (HOSPITAL_COMMUNITY): Payer: Self-pay

## 2021-12-30 ENCOUNTER — Inpatient Hospital Stay (HOSPITAL_COMMUNITY)
Admission: EM | Admit: 2021-12-30 | Discharge: 2022-01-01 | DRG: 442 | Disposition: A | Discharge status: Home Health | Payer: Medicare Other | Attending: Family Medicine | Admitting: Family Medicine

## 2021-12-30 ENCOUNTER — Emergency Department (HOSPITAL_COMMUNITY): Payer: Medicare Other

## 2021-12-30 ENCOUNTER — Other Ambulatory Visit: Payer: Self-pay

## 2021-12-30 DIAGNOSIS — Z90710 Acquired absence of both cervix and uterus: Secondary | ICD-10-CM

## 2021-12-30 DIAGNOSIS — D72819 Decreased white blood cell count, unspecified: Secondary | ICD-10-CM | POA: Diagnosis not present

## 2021-12-30 DIAGNOSIS — F32A Depression, unspecified: Secondary | ICD-10-CM | POA: Diagnosis not present

## 2021-12-30 DIAGNOSIS — R4701 Aphasia: Secondary | ICD-10-CM | POA: Diagnosis not present

## 2021-12-30 DIAGNOSIS — D6959 Other secondary thrombocytopenia: Secondary | ICD-10-CM | POA: Diagnosis present

## 2021-12-30 DIAGNOSIS — G9341 Metabolic encephalopathy: Secondary | ICD-10-CM | POA: Diagnosis not present

## 2021-12-30 DIAGNOSIS — Z9049 Acquired absence of other specified parts of digestive tract: Secondary | ICD-10-CM

## 2021-12-30 DIAGNOSIS — E878 Other disorders of electrolyte and fluid balance, not elsewhere classified: Secondary | ICD-10-CM | POA: Diagnosis present

## 2021-12-30 DIAGNOSIS — K7469 Other cirrhosis of liver: Secondary | ICD-10-CM | POA: Diagnosis not present

## 2021-12-30 DIAGNOSIS — Z886 Allergy status to analgesic agent status: Secondary | ICD-10-CM

## 2021-12-30 DIAGNOSIS — K7682 Hepatic encephalopathy: Secondary | ICD-10-CM | POA: Diagnosis not present

## 2021-12-30 DIAGNOSIS — Z79890 Hormone replacement therapy (postmenopausal): Secondary | ICD-10-CM

## 2021-12-30 DIAGNOSIS — Z79899 Other long term (current) drug therapy: Secondary | ICD-10-CM | POA: Diagnosis not present

## 2021-12-30 DIAGNOSIS — Z8249 Family history of ischemic heart disease and other diseases of the circulatory system: Secondary | ICD-10-CM

## 2021-12-30 DIAGNOSIS — K7460 Unspecified cirrhosis of liver: Secondary | ICD-10-CM | POA: Diagnosis present

## 2021-12-30 DIAGNOSIS — E8809 Other disorders of plasma-protein metabolism, not elsewhere classified: Secondary | ICD-10-CM | POA: Diagnosis present

## 2021-12-30 DIAGNOSIS — F419 Anxiety disorder, unspecified: Secondary | ICD-10-CM | POA: Diagnosis present

## 2021-12-30 DIAGNOSIS — Z91138 Patient's unintentional underdosing of medication regimen for other reason: Secondary | ICD-10-CM

## 2021-12-30 DIAGNOSIS — R112 Nausea with vomiting, unspecified: Secondary | ICD-10-CM | POA: Diagnosis present

## 2021-12-30 DIAGNOSIS — Z888 Allergy status to other drugs, medicaments and biological substances status: Secondary | ICD-10-CM | POA: Diagnosis not present

## 2021-12-30 DIAGNOSIS — Z20822 Contact with and (suspected) exposure to covid-19: Secondary | ICD-10-CM | POA: Diagnosis not present

## 2021-12-30 DIAGNOSIS — I480 Paroxysmal atrial fibrillation: Secondary | ICD-10-CM | POA: Diagnosis not present

## 2021-12-30 DIAGNOSIS — K754 Autoimmune hepatitis: Secondary | ICD-10-CM | POA: Diagnosis present

## 2021-12-30 DIAGNOSIS — D638 Anemia in other chronic diseases classified elsewhere: Secondary | ICD-10-CM | POA: Diagnosis not present

## 2021-12-30 DIAGNOSIS — K7290 Hepatic failure, unspecified without coma: Secondary | ICD-10-CM | POA: Diagnosis not present

## 2021-12-30 DIAGNOSIS — Z885 Allergy status to narcotic agent status: Secondary | ICD-10-CM

## 2021-12-30 DIAGNOSIS — E039 Hypothyroidism, unspecified: Secondary | ICD-10-CM | POA: Diagnosis present

## 2021-12-30 DIAGNOSIS — F418 Other specified anxiety disorders: Secondary | ICD-10-CM | POA: Diagnosis not present

## 2021-12-30 DIAGNOSIS — D684 Acquired coagulation factor deficiency: Secondary | ICD-10-CM | POA: Diagnosis present

## 2021-12-30 DIAGNOSIS — R4781 Slurred speech: Secondary | ICD-10-CM | POA: Diagnosis not present

## 2021-12-30 DIAGNOSIS — K766 Portal hypertension: Secondary | ICD-10-CM | POA: Diagnosis not present

## 2021-12-30 DIAGNOSIS — T473X6A Underdosing of saline and osmotic laxatives, initial encounter: Secondary | ICD-10-CM | POA: Diagnosis present

## 2021-12-30 LAB — CBC WITH DIFFERENTIAL/PLATELET
Abs Immature Granulocytes: 0.01 10*3/uL (ref 0.00–0.07)
Basophils Absolute: 0 10*3/uL (ref 0.0–0.1)
Basophils Relative: 1 %
Eosinophils Absolute: 0.3 10*3/uL (ref 0.0–0.5)
Eosinophils Relative: 7 %
HCT: 37.9 % (ref 36.0–46.0)
Hemoglobin: 12.7 g/dL (ref 12.0–15.0)
Immature Granulocytes: 0 %
Lymphocytes Relative: 18 %
Lymphs Abs: 0.7 10*3/uL (ref 0.7–4.0)
MCH: 33.2 pg (ref 26.0–34.0)
MCHC: 33.5 g/dL (ref 30.0–36.0)
MCV: 99 fL (ref 80.0–100.0)
Monocytes Absolute: 0.3 10*3/uL (ref 0.1–1.0)
Monocytes Relative: 7 %
Neutro Abs: 2.8 10*3/uL (ref 1.7–7.7)
Neutrophils Relative %: 67 %
Platelets: 100 10*3/uL — ABNORMAL LOW (ref 150–400)
RBC: 3.83 MIL/uL — ABNORMAL LOW (ref 3.87–5.11)
RDW: 13.9 % (ref 11.5–15.5)
WBC: 4.2 10*3/uL (ref 4.0–10.5)
nRBC: 0 % (ref 0.0–0.2)

## 2021-12-30 LAB — COMPREHENSIVE METABOLIC PANEL
ALT: 49 U/L — ABNORMAL HIGH (ref 0–44)
AST: 80 U/L — ABNORMAL HIGH (ref 15–41)
Albumin: 2.9 g/dL — ABNORMAL LOW (ref 3.5–5.0)
Alkaline Phosphatase: 90 U/L (ref 38–126)
Anion gap: 5 (ref 5–15)
BUN: 12 mg/dL (ref 6–20)
CO2: 22 mmol/L (ref 22–32)
Calcium: 8.7 mg/dL — ABNORMAL LOW (ref 8.9–10.3)
Chloride: 113 mmol/L — ABNORMAL HIGH (ref 98–111)
Creatinine, Ser: 1.01 mg/dL — ABNORMAL HIGH (ref 0.44–1.00)
GFR, Estimated: >60 mL/min (ref >60)
Glucose, Bld: 135 mg/dL — ABNORMAL HIGH (ref 70–99)
Potassium: 3.9 mmol/L (ref 3.5–5.1)
Sodium: 140 mmol/L (ref 135–145)
Total Bilirubin: 1.7 mg/dL — ABNORMAL HIGH (ref 0.3–1.2)
Total Protein: 6.8 g/dL (ref 6.5–8.1)

## 2021-12-30 LAB — AMMONIA: Ammonia: 95 umol/L — ABNORMAL HIGH (ref 9–35)

## 2021-12-30 LAB — LIPASE, BLOOD: Lipase: 217 U/L — ABNORMAL HIGH (ref 11–51)

## 2021-12-30 MED ORDER — LACTULOSE 10 GM/15ML PO SOLN
30.0000 g | Freq: Once | ORAL | Status: AC
  Administered 2021-12-30: 30 g via ORAL

## 2021-12-30 MED ORDER — LACTULOSE 10 GM/15ML PO SOLN
ORAL | Status: AC
  Filled 2021-12-30: qty 60

## 2021-12-30 MED ORDER — LACTATED RINGERS IV BOLUS
1000.0000 mL | Freq: Once | INTRAVENOUS | Status: AC
  Administered 2021-12-30: 1000 mL via INTRAVENOUS

## 2021-12-30 NOTE — ED Triage Notes (Addendum)
Patients family member said she has been having slurred speech since 10am. He said she gets like this when her ammonia levels are high. She takes lactulose every day and today she vomited and threw it up. He thinks she went most of the day without lactulose. She is alert and oriented x4 however delayed response. History of cirrhosis. PA is at bedside.

## 2021-12-30 NOTE — ED Provider Triage Note (Signed)
Emergency Medicine Provider Triage Evaluation Note  Connie Larsen , a 56 y.o. female  was evaluated in triage.  Pt complains of slurred speech.  Patient presents with her son who states that when her ammonia gets high her speech becomes slurred.  The patient son states that she has felt this way for 2 days.  Patient son states that she has taken 2 times her dose of lactulose, states that she has a history of cirrhosis.  The patient does have slurred speech on examination however she does not have any focal neurodeficits.  The patient cranial nerves II through XII are intact.  The patient has equal strength and sensation to upper and lower extremities bilaterally.  The patient does have flapping asterixis on examination.  Review of Systems  Positive:  Negative:   Physical Exam  BP (!) 150/62 (BP Location: Right Arm)   Pulse 90   Temp 98.3 F (36.8 C) (Oral)   Resp 16   SpO2 95%  Gen:   Awake, no distress   Resp:  Normal effort  MSK:   Moves extremities without difficulty  Other:    Medical Decision Making  Medically screening exam initiated at 8:16 PM.  Appropriate orders placed.  Elana Alm was informed that the remainder of the evaluation will be completed by another provider, this initial triage assessment does not replace that evaluation, and the importance of remaining in the ED until their evaluation is complete.     Al Decant, PA-C 12/30/21 2016

## 2021-12-30 NOTE — ED Provider Notes (Signed)
[facility]-EMERGENCY DEPT Provider Note   CSN: [id number] Arrival date & time: 12/30/21  2006     History {Add pertinent medical, surgical, social history, OB history to HPI:1} Chief Complaint  Patient presents with   Aphasia    Connie Larsen is a 56 y.o. female.  HPI Patient presents for altered mental status.  Medical history includes cirrhosis, depression, anxiety, atrial fibrillation, portal hypertension, anemia.  Patient has had nausea and vomiting over the past 2 to 3 days.  Typically, she takes lactulose but family believes that she has been unable to keep down her medications.  Patient and family deny any recent trauma.  Patient's family noted that she has very slowed speech.  This is consistent with her prior episodes of hepatic encephalopathy.  Patient was able to tolerate p.o. intake earlier tonight.  Patient currently denies any areas of discomfort.    Home Medications Prior to Admission medications   Medication Sig Start Date End Date Taking? Authorizing Provider  acetaminophen (TYLENOL) 500 MG tablet Take 500-1,000 mg by mouth every 6 (six) hours as needed (for pain).    [provider]  azaTHIOprine (IMURAN) 50 MG tablet Take 100 mg by mouth daily in the afternoon. 04/18/21   [provider]  escitalopram (LEXAPRO) 20 MG tablet Take 20 mg by mouth daily in the afternoon. 10/19/20   [provider]  ferrous sulfate 325 (65 FE) MG tablet Take 325 mg by mouth daily in the afternoon. 07/04/20   [provider]  lactulose (CHRONULAC) 10 GM/15ML solution Take 45 mLs (30 g total) by mouth 3 (three) times daily. 11/06/21   Amin, Loura Halt, MD  levOCARNitine (L-CARNITINE PO) Take 1 tablet by mouth daily in the afternoon.    [provider]  levothyroxine (SYNTHROID) 100 MCG tablet Take 100 mcg by mouth daily at 6 (six) AM. 05/31/21   [provider]  Menthol, Topical Analgesic, (ICY HOT EX) Apply 1  application. topically 4 (four) times daily as needed (pain.).    [provider]  Misc Natural Products (IMMUNE FORMULA PO) Take 2 tablets by mouth daily in the afternoon. Immune Plus    [provider]  Multiple Vitamin (MULTIVITAMIN WITH MINERALS) TABS tablet Take 2 tablets by mouth daily in the afternoon.    [provider]  ondansetron (ZOFRAN-ODT) 4 MG disintegrating tablet Take 1 tablet (4 mg total) by mouth every 8 (eight) hours as needed for nausea or vomiting. 11/06/21   Amin, Loura Halt, MD  Polyethyl Glycol-Propyl Glycol (LUBRICANT EYE DROPS) 0.4-0.3 % SOLN Place 1-2 drops into both eyes 3 (three) times daily as needed (dry/irritated eyes).    [provider]  primidone (MYSOLINE) 50 MG tablet Take 50 mg by mouth daily in the afternoon. 04/19/21   [provider]  rifaximin (XIFAXAN) 550 MG TABS tablet Take 1 tablet (550 mg total) by mouth 2 (two) times daily. 04/15/21   Burnadette Pop, MD  traZODone (DESYREL) 50 MG tablet Take 50 mg by mouth at bedtime.    [provider]  Zinc 100 MG TABS Take 200 mg by mouth daily in the afternoon.    [provider]      Allergies    Aspirin, Propoxyphene, Tramadol, Morphine and related, Tylenol [acetaminophen], and Prednisone    Review of Systems   Review of Systems  Gastrointestinal:  Positive for nausea and vomiting.  Neurological:  Positive for speech difficulty.  Psychiatric/Behavioral:  Positive for confusion.  All other systems reviewed and are negative.   Physical Exam Updated Vital Signs BP (!) 150/62 (BP Location: Right Arm)   Pulse 90   Temp 98.3 F (36.8 C) (Oral)   Resp 16   Ht 5\' 7"  (1.702 m)   Wt 130.2 kg   SpO2 95%   BMI 44.95 kg/m  Physical Exam Vitals and nursing note reviewed.  Constitutional:      General: She is not in acute distress.    Appearance: She is well-developed. She is ill-appearing (Chronically). She is not toxic-appearing or  diaphoretic.  HENT:     Head: Normocephalic and atraumatic.     Right Ear: External ear normal.     Left Ear: External ear normal.     Nose: Nose normal.     Mouth/Throat:     Mouth: Mucous membranes are moist.     Pharynx: Oropharynx is clear.  Eyes:     General: No scleral icterus.    Extraocular Movements: Extraocular movements intact.     Conjunctiva/sclera: Conjunctivae normal.  Cardiovascular:     Rate and Rhythm: Normal rate and regular rhythm.     Heart sounds: No murmur heard. Pulmonary:     Effort: Pulmonary effort is normal. No respiratory distress.     Breath sounds: No wheezing or rales.  Abdominal:     General: There is no distension.     Palpations: Abdomen is soft.     Tenderness: There is no abdominal tenderness.  Musculoskeletal:        General: No swelling. Normal range of motion.     Cervical back: Normal range of motion and neck supple.     Right lower leg: No edema.     Left lower leg: No edema.  Skin:    General: Skin is warm and dry.     Capillary Refill: Capillary refill takes less than 2 seconds.  Neurological:     General: No focal deficit present.     Mental Status: She is alert and oriented to person, place, and time.     Comments: Asterixis is present  Psychiatric:        Mood and Affect: Mood normal.        Behavior: Behavior normal.     ED Results / Procedures / Treatments   Labs (all labs ordered are listed, but only abnormal results are displayed) Labs Reviewed  CBC WITH DIFFERENTIAL/PLATELET - Abnormal; Notable for the following components:      Result Value   RBC 3.83 (*)    Platelets 100 (*)    All other components within normal limits  COMPREHENSIVE METABOLIC PANEL - Abnormal; Notable for the following components:   Chloride 113 (*)    Glucose, Bld 135 (*)    Creatinine, Ser 1.01 (*)    Calcium 8.7 (*)    Albumin 2.9 (*)    AST 80 (*)    ALT 49 (*)    Total Bilirubin 1.7 (*)    All other components within normal limits   LIPASE, BLOOD - Abnormal; Notable for the following components:   Lipase 217 (*)    All other components within normal limits  AMMONIA - Abnormal; Notable for the following components:   Ammonia 95 (*)    All other components within normal limits  URINALYSIS, ROUTINE W REFLEX MICROSCOPIC    EKG None  Radiology CT Head Wo Contrast  Result Date: 12/30/2021 CLINICAL DATA:  Slurred speech EXAM: CT HEAD WITHOUT CONTRAST TECHNIQUE: Contiguous  axial images were obtained from the base of the skull through the vertex without intravenous contrast. RADIATION DOSE REDUCTION: This exam was performed according to the departmental dose-optimization program which includes automated exposure control, adjustment of the mA and/or kV according to patient size and/or use of iterative reconstruction technique. COMPARISON:  MR head 04/11/2021 and CT head 04/11/2021 FINDINGS: Brain: No intracranial hemorrhage, mass effect, or evidence of acute infarct. No hydrocephalus. No extra-axial fluid collection. Vascular: No hyperdense vessel or unexpected calcification. Skull: No fracture or focal lesion. Sinuses/Orbits: Moderate mucosal thickening left maxillary sinus. Opacification of a few left mastoid air cells. The right mastoid air cells and remaining paranasal sinuses are well aerated. Other: None. IMPRESSION: Unremarkable noncontrast CT of the head. Electronically Signed   By: Minerva Fester M.D.   On: 12/30/2021 21:53    Procedures Procedures  {Document cardiac monitor, telemetry assessment procedure when appropriate:1}  Medications Ordered in ED Medications - No data to display  ED Course/ Medical Decision Making/ A&P                           Medical Decision Making  ***  {Document critical care time when appropriate:1} {Document review of labs and clinical decision tools ie heart score, Chads2Vasc2 etc:1}  {Document your independent review of radiology images, and any outside records:1} {Document  your discussion with family members, caretakers, and with consultants:1} {Document social determinants of health affecting pt's care:1} {Document your decision making why or why not admission, treatments were needed:1} Final Clinical Impression(s) / ED Diagnoses Final diagnoses:  None    Rx / DC Orders ED Discharge Orders     None

## 2021-12-31 ENCOUNTER — Encounter (HOSPITAL_COMMUNITY): Payer: Self-pay | Admitting: Internal Medicine

## 2021-12-31 ENCOUNTER — Observation Stay (HOSPITAL_COMMUNITY): Payer: Medicare Other

## 2021-12-31 DIAGNOSIS — I480 Paroxysmal atrial fibrillation: Secondary | ICD-10-CM | POA: Diagnosis present

## 2021-12-31 DIAGNOSIS — F418 Other specified anxiety disorders: Secondary | ICD-10-CM | POA: Diagnosis not present

## 2021-12-31 DIAGNOSIS — Z888 Allergy status to other drugs, medicaments and biological substances status: Secondary | ICD-10-CM | POA: Diagnosis not present

## 2021-12-31 DIAGNOSIS — E8809 Other disorders of plasma-protein metabolism, not elsewhere classified: Secondary | ICD-10-CM | POA: Diagnosis present

## 2021-12-31 DIAGNOSIS — D72819 Decreased white blood cell count, unspecified: Secondary | ICD-10-CM | POA: Diagnosis present

## 2021-12-31 DIAGNOSIS — Z20822 Contact with and (suspected) exposure to covid-19: Secondary | ICD-10-CM | POA: Diagnosis present

## 2021-12-31 DIAGNOSIS — G9341 Metabolic encephalopathy: Secondary | ICD-10-CM

## 2021-12-31 DIAGNOSIS — R112 Nausea with vomiting, unspecified: Secondary | ICD-10-CM | POA: Diagnosis present

## 2021-12-31 DIAGNOSIS — Z885 Allergy status to narcotic agent status: Secondary | ICD-10-CM | POA: Diagnosis not present

## 2021-12-31 DIAGNOSIS — E039 Hypothyroidism, unspecified: Secondary | ICD-10-CM | POA: Diagnosis present

## 2021-12-31 DIAGNOSIS — D6959 Other secondary thrombocytopenia: Secondary | ICD-10-CM | POA: Diagnosis present

## 2021-12-31 DIAGNOSIS — D638 Anemia in other chronic diseases classified elsewhere: Secondary | ICD-10-CM | POA: Diagnosis present

## 2021-12-31 DIAGNOSIS — K7682 Hepatic encephalopathy: Secondary | ICD-10-CM | POA: Diagnosis present

## 2021-12-31 DIAGNOSIS — D684 Acquired coagulation factor deficiency: Secondary | ICD-10-CM | POA: Diagnosis present

## 2021-12-31 DIAGNOSIS — Z79899 Other long term (current) drug therapy: Secondary | ICD-10-CM | POA: Diagnosis not present

## 2021-12-31 DIAGNOSIS — Z79890 Hormone replacement therapy (postmenopausal): Secondary | ICD-10-CM | POA: Diagnosis not present

## 2021-12-31 DIAGNOSIS — K7469 Other cirrhosis of liver: Secondary | ICD-10-CM

## 2021-12-31 DIAGNOSIS — Z886 Allergy status to analgesic agent status: Secondary | ICD-10-CM | POA: Diagnosis not present

## 2021-12-31 DIAGNOSIS — F32A Depression, unspecified: Secondary | ICD-10-CM | POA: Diagnosis present

## 2021-12-31 DIAGNOSIS — Z9049 Acquired absence of other specified parts of digestive tract: Secondary | ICD-10-CM | POA: Diagnosis not present

## 2021-12-31 DIAGNOSIS — F419 Anxiety disorder, unspecified: Secondary | ICD-10-CM | POA: Diagnosis present

## 2021-12-31 DIAGNOSIS — R4701 Aphasia: Secondary | ICD-10-CM | POA: Diagnosis present

## 2021-12-31 DIAGNOSIS — K754 Autoimmune hepatitis: Secondary | ICD-10-CM | POA: Diagnosis present

## 2021-12-31 DIAGNOSIS — E878 Other disorders of electrolyte and fluid balance, not elsewhere classified: Secondary | ICD-10-CM | POA: Diagnosis present

## 2021-12-31 DIAGNOSIS — Z8249 Family history of ischemic heart disease and other diseases of the circulatory system: Secondary | ICD-10-CM | POA: Diagnosis not present

## 2021-12-31 DIAGNOSIS — K766 Portal hypertension: Secondary | ICD-10-CM | POA: Diagnosis present

## 2021-12-31 LAB — URINALYSIS, ROUTINE W REFLEX MICROSCOPIC
Bacteria, UA: NONE SEEN
Bilirubin Urine: NEGATIVE
Glucose, UA: NEGATIVE mg/dL
Ketones, ur: NEGATIVE mg/dL
Nitrite: NEGATIVE
Protein, ur: NEGATIVE mg/dL
Specific Gravity, Urine: 1.024 (ref 1.005–1.030)
pH: 5 (ref 5.0–8.0)

## 2021-12-31 LAB — COMPREHENSIVE METABOLIC PANEL
ALT: 40 U/L (ref 0–44)
AST: 64 U/L — ABNORMAL HIGH (ref 15–41)
Albumin: 2.3 g/dL — ABNORMAL LOW (ref 3.5–5.0)
Alkaline Phosphatase: 70 U/L (ref 38–126)
Anion gap: 4 — ABNORMAL LOW (ref 5–15)
BUN: 14 mg/dL (ref 6–20)
CO2: 26 mmol/L (ref 22–32)
Calcium: 8.1 mg/dL — ABNORMAL LOW (ref 8.9–10.3)
Chloride: 113 mmol/L — ABNORMAL HIGH (ref 98–111)
Creatinine, Ser: 0.82 mg/dL (ref 0.44–1.00)
GFR, Estimated: >60 mL/min (ref >60)
Glucose, Bld: 93 mg/dL (ref 70–99)
Potassium: 3.8 mmol/L (ref 3.5–5.1)
Sodium: 143 mmol/L (ref 135–145)
Total Bilirubin: 1.7 mg/dL — ABNORMAL HIGH (ref 0.3–1.2)
Total Protein: 5.6 g/dL — ABNORMAL LOW (ref 6.5–8.1)

## 2021-12-31 LAB — CBC WITH DIFFERENTIAL/PLATELET
Abs Immature Granulocytes: 0 10*3/uL (ref 0.00–0.07)
Basophils Absolute: 0 10*3/uL (ref 0.0–0.1)
Basophils Relative: 1 %
Eosinophils Absolute: 0.2 10*3/uL (ref 0.0–0.5)
Eosinophils Relative: 8 %
HCT: 32.5 % — ABNORMAL LOW (ref 36.0–46.0)
Hemoglobin: 10.6 g/dL — ABNORMAL LOW (ref 12.0–15.0)
Immature Granulocytes: 0 %
Lymphocytes Relative: 24 %
Lymphs Abs: 0.6 10*3/uL — ABNORMAL LOW (ref 0.7–4.0)
MCH: 32.7 pg (ref 26.0–34.0)
MCHC: 32.6 g/dL (ref 30.0–36.0)
MCV: 100.3 fL — ABNORMAL HIGH (ref 80.0–100.0)
Monocytes Absolute: 0.3 10*3/uL (ref 0.1–1.0)
Monocytes Relative: 11 %
Neutro Abs: 1.3 10*3/uL — ABNORMAL LOW (ref 1.7–7.7)
Neutrophils Relative %: 56 %
Platelets: 71 10*3/uL — ABNORMAL LOW (ref 150–400)
RBC: 3.24 MIL/uL — ABNORMAL LOW (ref 3.87–5.11)
RDW: 13.6 % (ref 11.5–15.5)
WBC: 2.4 10*3/uL — ABNORMAL LOW (ref 4.0–10.5)
nRBC: 0 % (ref 0.0–0.2)

## 2021-12-31 LAB — MAGNESIUM: Magnesium: 1.9 mg/dL (ref 1.7–2.4)

## 2021-12-31 LAB — SARS CORONAVIRUS 2 BY RT PCR: SARS Coronavirus 2 by RT PCR: NEGATIVE

## 2021-12-31 LAB — AMMONIA: Ammonia: 54 umol/L — ABNORMAL HIGH (ref 9–35)

## 2021-12-31 LAB — HIV ANTIBODY (ROUTINE TESTING W REFLEX): HIV Screen 4th Generation wRfx: NONREACTIVE

## 2021-12-31 MED ORDER — ONDANSETRON HCL 4 MG/2ML IJ SOLN: 4.0000 mg | Freq: Four times a day (QID) | INTRAMUSCULAR | Status: DC | PRN

## 2021-12-31 MED ORDER — PRIMIDONE 50 MG PO TABS
50.0000 mg | ORAL_TABLET | Freq: Every day | ORAL | Status: DC
  Administered 2021-12-31: 50 mg via ORAL
  Filled 2021-12-31 (×2): qty 1

## 2021-12-31 MED ORDER — ACETAMINOPHEN 325 MG PO TABS
325.0000 mg | ORAL_TABLET | Freq: Four times a day (QID) | ORAL | Status: DC | PRN
  Administered 2021-12-31 (×2): 325 mg via ORAL
  Filled 2021-12-31 (×2): qty 1

## 2021-12-31 MED ORDER — ESCITALOPRAM OXALATE 20 MG PO TABS
20.0000 mg | ORAL_TABLET | Freq: Every day | ORAL | Status: DC
  Administered 2021-12-31: 20 mg via ORAL
  Filled 2021-12-31: qty 1

## 2021-12-31 MED ORDER — ONDANSETRON HCL 4 MG PO TABS: 4.0000 mg | ORAL_TABLET | Freq: Four times a day (QID) | ORAL | Status: DC | PRN

## 2021-12-31 MED ORDER — TRAZODONE HCL 50 MG PO TABS
50.0000 mg | ORAL_TABLET | Freq: Every day | ORAL | Status: DC
  Administered 2021-12-31: 50 mg via ORAL
  Filled 2021-12-31: qty 1

## 2021-12-31 MED ORDER — LACTULOSE 10 GM/15ML PO SOLN
30.0000 g | Freq: Three times a day (TID) | ORAL | Status: DC
  Administered 2021-12-31 – 2022-01-01 (×4): 30 g via ORAL
  Filled 2021-12-31 (×4): qty 45

## 2021-12-31 MED ORDER — POLYETHYLENE GLYCOL 3350 17 G PO PACK: 17.0000 g | PACK | Freq: Every day | ORAL | Status: DC | PRN

## 2021-12-31 MED ORDER — RIFAXIMIN 550 MG PO TABS
550.0000 mg | ORAL_TABLET | Freq: Two times a day (BID) | ORAL | Status: DC
  Administered 2021-12-31 – 2022-01-01 (×3): 550 mg via ORAL
  Filled 2021-12-31 (×3): qty 1

## 2021-12-31 MED ORDER — AZATHIOPRINE 50 MG PO TABS
100.0000 mg | ORAL_TABLET | Freq: Every day | ORAL | Status: DC
  Administered 2021-12-31: 100 mg via ORAL
  Filled 2021-12-31 (×2): qty 2

## 2021-12-31 MED ORDER — LEVOTHYROXINE SODIUM 100 MCG PO TABS
100.0000 ug | ORAL_TABLET | Freq: Every day | ORAL | Status: DC
  Administered 2021-12-31 – 2022-01-01 (×2): 100 ug via ORAL
  Filled 2021-12-31 (×2): qty 1

## 2021-12-31 NOTE — ED Notes (Signed)
MD Shalhoub made aware about wanting something for her headache.  Per MD, patient cannot take ibuprofen d/t her INR levels, and tylenol would be safer.  Patient reports she doesn't have an actual allergy to tylenol, was just told she couldn't take it due to her liver.  Per MD, it's safer for patient to take low dose tylenol and patient OK with that.

## 2021-12-31 NOTE — Assessment & Plan Note (Signed)
.   Resume home regimen of Synthroid 

## 2021-12-31 NOTE — Assessment & Plan Note (Signed)
   Continue home regimen of psychotropic therapy 

## 2021-12-31 NOTE — Assessment & Plan Note (Signed)
   Known history of autoimmune hepatitis with cirrhosis of the liver  Continue home regimen of azathioprine  Continue outpatient follow-up with [facility] (last seen 05/2021)

## 2021-12-31 NOTE — Assessment & Plan Note (Signed)
   Currently rate controlled without AV nodal blocking therapy  Not on anticoagulation due to underlying coagulopathy due to liver disease  Monitoring on telemetry

## 2021-12-31 NOTE — H&P (Addendum)
History and Physical    Patient: Connie Larsen MRN: [id number] DOA: 12/30/2021  Date of Service: the patient was seen and examined on 12/31/2021  Patient coming from: Home  Chief Complaint:  Chief Complaint  Patient presents with   Aphasia    HPI:   56 year old female with past medical history of anemia of chronic disease, autoimmune hepatitis with cirrhosis, depression, paroxysmal atrial fibrillation (not on anticoagulation),  hypothyroidism, PTSD who presents to [facility] long hospital emergency department due to family concerns over increasing lethargy.  Despite patient's lethargy and intermittent confusion patient is still a seemingly reliable historian throughout the interview.  Additional history has been obtained from discussions with the emergency department staff, review of ED/triage notes and information obtained from family.  For approximate the past 3 days the patient has been experiencing episodes of nausea and vomiting.  Over the span of time the patient has been unable to consistently keep down her lactulose and therefore has begun to exhibit increasingly worsening lethargy.  Family reported associated slurred speech and increasing confusion.  Upon further questioning there has been no recent fever, cough, dysuria or diarrhea.  Due to progressively worsening symptoms patient was brought into [facility] emergency department for evaluation.  Upon evaluation in the emergency department patient was clinically felt to be suffering from hepatic encephalopathy.  Ammonia was obtained and was found to be 95, compared to 48 at time of discharge during last hospitalization for hepatic encephalopathy.  Patient was administered a dose of lactulose as well as 1 L of lactated Ringer solution and the hospitalist group was then called to assess the patient for admission to the hospital.  Review of Systems: Review of Systems  Neurological:  Positive for weakness.  All other systems  reviewed and are negative.    Past Medical History:  Diagnosis Date   Anemia    Arthritis    Cirrhosis of liver (HCC) 2020   in [place]   Depression    Dyspnea    Dysrhythmia 06/11/2021   a-fib   Hepatitis 10/24/2019   in [facility] - autoimmune   Hypothyroidism    Liver disease 2002   Memory loss 09/29/2018   in [facility]   PTSD (post-traumatic stress disorder)    Sleep apnea    Thyroid disease     Past Surgical History:  Procedure Laterality Date   ABDOMINAL HYSTERECTOMY     CHOLECYSTECTOMY     HAND TENDON SURGERY Right    when pt. was a child   ORIF ANKLE FRACTURE Right 08/02/2021   Procedure: OPEN REDUCTION INTERNAL FIXATION (ORIF) RIGHT DISTAL FIBULA, POSSIBLE SYNDESMOSIS;  Surgeon: Netta Cedars, MD;  Location: [facility] OR;  Service: Orthopedics;  Laterality: Right;  120 wants to follow 2nd case   TONSILLECTOMY      Social History:  reports that she has never smoked. She has never used smokeless tobacco. She reports that she does not drink alcohol and does not use drugs.  Allergies  Allergen Reactions   Aspirin Hives   Propoxyphene Anaphylaxis   Tramadol Anaphylaxis   Morphine And Related Nausea And Vomiting   Tylenol [Acetaminophen] Other (See Comments)    Was told not to take due to liver   Prednisone Rash    Family History  Problem Relation Age of Onset   Heart disease Mother    Arrhythmia Father        pacemaker   Heart disease Sister    Arrhythmia Brother  pacemaker    Prior to Admission medications   Medication Sig Start Date End Date Taking? Authorizing Provider  azaTHIOprine (IMURAN) 50 MG tablet Take 100 mg by mouth daily in the afternoon. 04/18/21  Yes [provider]  escitalopram (LEXAPRO) 20 MG tablet Take 20 mg by mouth daily in the afternoon. 10/19/20  Yes [provider]  ferrous sulfate 325 (65 FE) MG tablet Take 325 mg by mouth daily in the afternoon. 07/04/20  Yes [provider]  levOCARNitine (L-CARNITINE PO) Take 1  tablet by mouth daily in the afternoon.   Yes [provider]  levothyroxine (SYNTHROID) 100 MCG tablet Take 100 mcg by mouth daily at 6 (six) AM. 05/31/21  Yes [provider]  Misc Natural Products (IMMUNE FORMULA PO) Take 2 tablets by mouth daily in the afternoon. Immune Plus   Yes [provider]  Polyethyl Glycol-Propyl Glycol (LUBRICANT EYE DROPS) 0.4-0.3 % SOLN Place 1-2 drops into both eyes 3 (three) times daily as needed (dry/irritated eyes).   Yes [provider]  primidone (MYSOLINE) 50 MG tablet Take 50 mg by mouth daily in the afternoon. 04/19/21  Yes [provider]  rifaximin (XIFAXAN) 550 MG TABS tablet Take 1 tablet (550 mg total) by mouth 2 (two) times daily. 04/15/21  Yes Burnadette Pop, MD  traZODone (DESYREL) 50 MG tablet Take 50 mg by mouth at bedtime.   Yes [provider]  Zinc 100 MG TABS Take 200 mg by mouth daily in the afternoon.   Yes [provider]  acetaminophen (TYLENOL) 500 MG tablet Take 500-1,000 mg by mouth every 6 (six) hours as needed (for pain). Patient not taking: Reported on 12/30/2021    [provider]  lactulose (CHRONULAC) 10 GM/15ML solution Take 45 mLs (30 g total) by mouth 3 (three) times daily. Patient not taking: Reported on 12/30/2021 11/06/21   Dimple Nanas, MD  ondansetron (ZOFRAN-ODT) 4 MG disintegrating tablet Take 1 tablet (4 mg total) by mouth every 8 (eight) hours as needed for nausea or vomiting. Patient not taking: Reported on 12/30/2021 11/06/21   Dimple Nanas, MD    Physical Exam:  Vitals:   12/31/21 0349 12/31/21 0400 12/31/21 0444 12/31/21 0451  BP:  (!) 152/77 (!) 149/67   Pulse:  67 69   Resp:  18 17   Temp: 97.8 F (36.6 C)  98.2 F (36.8 C)   TempSrc: Oral  Oral   SpO2:  94% 98%   Weight:    130.6 kg  Height:    5\' 7"  (1.702 m)   Constitutional: Lethargic but arousable, oriented x3, no associated distress.   Skin: no rashes, no lesions,  good skin turgor noted. Eyes: Pupils are equally reactive to light.  No evidence of scleral icterus or conjunctival pallor.  ENMT: Moist mucous membranes noted.  Posterior pharynx clear of any exudate or lesions.   Neck: normal, supple, no masses, no thyromegaly.  No evidence of jugular venous distension.   Respiratory: clear to auscultation bilaterally, no wheezing, no crackles. Normal respiratory effort. No accessory muscle use.  Cardiovascular: Regular rate and rhythm, no murmurs / rubs / gallops. No extremity edema. 2+ pedal pulses. No carotid bruits.  Chest:   Nontender without crepitus or deformity.   Back:   Nontender without crepitus or deformity. Abdomen: Very lower abdominal tenderness otherwise abdomen is soft and nontender..  No evidence of intra-abdominal masses.  Positive bowel sounds noted in all quadrants.   Musculoskeletal: No joint  deformity upper and lower extremities. Good ROM, no contractures. Normal muscle tone.  Neurologic: Notable asterixis on examination.  Patient is lethargic but arousable and oriented x3.   Sensation intact.  Patient moving all 4 extremities spontaneously.  Patient is following all commands.  Patient is responsive to verbal stimuli.   Psychiatric: Patient exhibits somewhat depressed mood with flat affect.  Patient still seems to possess insight as to their current situation.    Data Reviewed:  I have personally reviewed and interpreted labs, imaging.  Significant findings are:  CBC revealing white blood cell count of 4.2, hemoglobin of 12.2, hematocrit 37.9, platelet count of 100 Sodium 140, potassium 3.9, chloride 113, bicarbonate 22, BUN 12, creatinine 1.01 Lipase 217 Ammonia 95 CT imaging of the head reveals no evidence of acute intracranial process  EKG: Personally reviewed.  Rhythm is normal sinus rhythm with heart rate of 69 bpm.  No dynamic ST segment changes appreciated.   Assessment and Plan: * Acute metabolic encephalopathy Patient  presenting with increasing lethargy and confusion after not being able to tolerate her usual maintenance regimen of lactulose for several days Presentation/exam consistent with hepatic encephalopathy Resuming lactulose 3 times daily to attempt to achieve 3 or more loose stools daily Rifaximin twice daily Oh evaluating for any evidence of underlying infection including obtaining urinalysis chest x-ray and COVID-19 PCR testing. No clinical evidence of GI bleeding We will consider gastroenterology consultation if patient does not rapidly clinically improved.  Nausea and vomiting Several day history of nausea and vomiting led to intolerance of lactulose with led to this bout of hepatic encephalopathy  Symptoms seem to have resolved prior to arrival and likely were secondary to a viral gastroenteritis   Cirrhosis of liver (HCC) Known history of autoimmune hepatitis with cirrhosis of the liver Continue home regimen of azathioprine Continue outpatient follow-up with [facility] gastroenterology (last seen 05/2021)  Hypothyroidism Resume home regimen of Synthroid    Depression with anxiety Continue home regimen of psychotropic therapy  Paroxysmal atrial fibrillation (HCC) Currently rate controlled without AV nodal blocking therapy Not on anticoagulation due to underlying coagulopathy due to liver disease Monitoring on telemetry       Code Status:  Full code  code status decision has been confirmed with: patient   Consults: None  Severity of Illness:  The appropriate patient status for this patient is OBSERVATION. Observation status is judged to be reasonable and necessary in order to provide the required intensity of service to ensure the patient's safety. The patient's presenting symptoms, physical exam findings, and initial radiographic and laboratory data in the context of their medical condition is felt to place them at decreased risk for further clinical deterioration.  Furthermore, it is anticipated that the patient will be medically stable for discharge from the hospital within 2 midnights of admission.   Author:  Marinda Elk MD  12/31/2021 7:28 AM

## 2021-12-31 NOTE — Progress Notes (Signed)
  Progress Note   Patient: Connie Larsen NFA:[id number] DOB: 1966-03-17 DOA: 12/30/2021     0 DOS: the patient was seen and examined on 12/31/2021   Brief hospital course: 56 year old woman PMH autoimmune hepatitis with cirrhosis, paroxysmal atrial fibrillation not on anticoagulation presented with lethargy, nausea, vomiting, inability to tolerate lactulose, slurred speech and confusion (associated with hyperammonemia).  Admitted for acute hepatic encephalopathy secondary to inability to tolerate lactulose, nausea and vomiting.  Lactulose and rifaximin resumed.  Assessment and Plan: * Acute metabolic encephalopathy secondary to hepatic encephalopathy --Improved but not back to baseline.  Continue rifaximin, lactulose.  Serum ammonia improved.  Nausea and vomiting --Several day history of nausea and vomiting led to intolerance of lactulose with led to this bout of hepatic encephalopathy  --Resolved.  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 --Continue home regimen of azathioprine --Continue outpatient follow-up with [facility] gastroenterology (last seen 05/2021)  Hypothyroidism --Synthroid  Depression with anxiety --Continue home regimen of psychotropic therapy  Paroxysmal atrial fibrillation (HCC) --Currently rate controlled without AV nodal blocking therapy. Not on anticoagulation due to underlying coagulopathy due to liver disease     Subjective:  Has some abd pain, generalizes No vomiting today Did eat some breakfast  Physical Exam: Vitals:   12/31/21 0444 12/31/21 0451 12/31/21 0850 12/31/21 1507  BP: (!) 149/67  (!) 123/50 138/62  Pulse: 69  66 70  Resp: 17  15 18   Temp: 98.2 F (36.8 C)  97.6 F (36.4 C) 97.7 F (36.5 C)  TempSrc: Oral  Oral Oral  SpO2: 98%  94% 96%  Weight:  130.6 kg    Height:  5\' 7"  (1.702 m)     Physical  Exam Vitals reviewed.  Constitutional:      General: She is not in acute distress.    Appearance: She is ill-appearing. She is not toxic-appearing.  Cardiovascular:     Rate and Rhythm: Normal rate and regular rhythm.     Heart sounds: No murmur heard. Pulmonary:     Effort: Pulmonary effort is normal. No respiratory distress.     Breath sounds: No wheezing, rhonchi or rales.  Abdominal:     General: There is no distension.     Tenderness: There is abdominal tenderness (generalized).  Neurological:     Mental Status: She is alert and oriented to person, place, and time.     Comments: Speech slow  Psychiatric:        Mood and Affect: Mood normal.        Behavior: Behavior normal.     Data Reviewed:  Creatinine stable 0.82 Magnesium within normal limits AST mildly elevated consistent with known liver disease Serum ammonia 95 > 54 Total bilirubin 1.7 consistent with chronic liver disease Hemoglobin stable 10.6 compared to June of this year, platelets stable at 71, leukopenia stable  Family Communication: none  Disposition: Status is: Inpatient   Planned Discharge Destination: Home    Time spent: 35 minutes  Author: , MD 12/31/2021 8:37 PM  For on call review [email or web address].

## 2021-12-31 NOTE — Assessment & Plan Note (Signed)
   Several day history of nausea and vomiting led to intolerance of lactulose with led to this bout of hepatic encephalopathy   Symptoms seem to have resolved prior to arrival and likely were secondary to a viral gastroenteritis

## 2021-12-31 NOTE — Assessment & Plan Note (Addendum)
   Patient presenting with increasing lethargy and confusion after not being able to tolerate her usual maintenance regimen of lactulose for several days  Presentation/exam consistent with hepatic encephalopathy  Resuming lactulose 3 times daily to attempt to achieve 3 or more loose stools daily  Rifaximin twice daily  Oh evaluating for any evidence of underlying infection including obtaining urinalysis chest x-ray and COVID-19 PCR testing.  No clinical evidence of GI bleeding  We will consider gastroenterology consultation if patient does not rapidly clinically improved.

## 2021-12-31 NOTE — Hospital Course (Addendum)
56 year old woman PMH autoimmune hepatitis with cirrhosis, paroxysmal atrial fibrillation not on anticoagulation presented with lethargy, nausea, vomiting, inability to tolerate lactulose, slurred speech and confusion (associated with hyperammonemia).  Admitted for acute hepatic encephalopathy secondary to inability to tolerate lactulose, nausea and vomiting.  Lactulose and rifaximin resumed.  CXR abnormal, clinically not significant

## 2022-01-01 DIAGNOSIS — G9341 Metabolic encephalopathy: Secondary | ICD-10-CM | POA: Diagnosis not present

## 2022-01-01 DIAGNOSIS — K7469 Other cirrhosis of liver: Secondary | ICD-10-CM | POA: Diagnosis not present

## 2022-01-01 DIAGNOSIS — K7682 Hepatic encephalopathy: Secondary | ICD-10-CM | POA: Diagnosis not present

## 2022-01-01 LAB — AMMONIA: Ammonia: 58 umol/L — ABNORMAL HIGH (ref 9–35)

## 2022-01-01 NOTE — Progress Notes (Signed)
.   Transition of Care [facility]) Screening Note   Patient Details  Name: [place] Giammona Date of Birth: 11/08/1965   Transition of [facility]) CM/SW Contact:    Larrie Kass, LCSW Phone Number: 01/01/2022, 12:22 PM    Transition of Care Department [facility]) has reviewed patient and no TOC needs have been identified at this time. We will continue to monitor patient advancement through interdisciplinary progression rounds. If new patient transition needs arise, please place a TOC consult.

## 2022-01-01 NOTE — TOC Initial Note (Signed)
Transition of Care [facility]) - Initial/Assessment Note    Patient Details  Name: Connie Larsen MRN: [id number] Date of Birth: 06/21/1965  Transition of Care [facility]) CM/SW Contact:    Larrie Kass, LCSW Phone Number: 01/01/2022, 2:48 PM  Clinical Narrative:                 CSW spoke with pt about DME recommendations pt stated she would like a RW. CSW also discuss HH services, pt stated she has never had HH and would like to have it in place. Pt does not have a preferred agency, CSW informed pt she will send a referral out to all agencies in the area.  CSW sent referral out to [facility] to  arranged RW to be delivered to pt's room.  Pt was declined by the following [facility] agencies [facility], [facility], [facility] and [facility]. Unable to get [facility] services set up. CSW will send referral for OP PT.    Expected Discharge Plan: Home w Home Health Services Barriers to Discharge: Equipment Delay   Patient Goals and CMS Choice Patient states their goals for this hospitalization and ongoing recovery are:: return home CMS Medicare.gov Compare Post Acute Care list provided to:: Patient Choice offered to / list presented to : Patient  Expected Discharge Plan and Services Expected Discharge Plan: Home w Home Health Services     Post Acute Care Choice: Home Health Living arrangements for the past 2 months: Single Family Home Expected Discharge Date: 01/01/22               DME Arranged: [facility] wide DME Agency: [facility] Date DME Agency Contacted: 01/01/22 Time DME Agency Contacted: [phone] Representative spoke with at DME Agency: Ledell Peoples            Prior Living Arrangements/Services Living arrangements for the past 2 months: Single Family Home Lives with:: Self Patient language and need for interpreter reviewed:: Yes Do you feel safe going back to the place where you live?: Yes      Need for Family Participation in Patient Care: No (Comment) Care giver support  system in place?: No (comment) Current home services: DME, Home PT Criminal Activity/Legal Involvement Pertinent to Current Situation/Hospitalization: No - Comment as needed  Activities of Daily Living Home Assistive Devices/Equipment: None ADL Screening (condition at time of admission) Patient's cognitive ability adequate to safely complete daily activities?: No Is the patient deaf or have difficulty hearing?: No Does the patient have difficulty seeing, even when wearing glasses/contacts?: No Does the patient have difficulty concentrating, remembering, or making decisions?: Yes Patient able to express need for assistance with ADLs?: Yes Does the patient have difficulty dressing or bathing?: No Independently performs ADLs?: Yes (appropriate for developmental age) Does the patient have difficulty walking or climbing stairs?: No Weakness of Legs: Both Weakness of Arms/Hands: Both  Permission Sought/Granted   Permission granted to share information with : Yes, Verbal Permission Granted              Emotional Assessment Appearance:: Appears stated age Attitude/Demeanor/Rapport: Gracious Affect (typically observed): Accepting Orientation: : Oriented to Self, Oriented to Place, Oriented to  Time, Oriented to Situation Alcohol / Substance Use: Not Applicable Psych Involvement: No (comment)  Admission diagnosis:  Acute metabolic encephalopathy [G93.41] Patient Active Problem List   Diagnosis Date Noted   Nausea and vomiting 12/31/2021   Acute metabolic encephalopathy 12/30/2021   Paroxysmal atrial fibrillation (HCC) 11/04/2021   Hepatic encephalopathy (HCC) 04/11/2021   Cirrhosis of  liver (HCC) 04/11/2021   Hypothyroidism 04/11/2021   Depression with anxiety 09/22/2019   Portal hypertension (HCC) 08/20/2017   PCP:  Ailene Ravel, MD Pharmacy:   [facility] [address] ([address]), [place] [address] [place] ([place]) [place] [address] Phone:  [phone] Fax: [phone]     Social Determinants of Health (SDOH) Interventions    Readmission Risk Interventions     No data to display

## 2022-01-01 NOTE — Discharge Summary (Signed)
Physician Discharge Summary   Patient: Connie Larsen MRN: [id number] DOB: 11-30-1965  Admit date:     12/30/2021  Discharge date: 01/01/22  Discharge Physician: Brendia Sacks   PCP: Ailene Ravel, MD   Recommendations at discharge:    Hepatic encephalopathy  Discharge Diagnoses: Principal Problem:   Acute metabolic encephalopathy Active Problems:   Nausea and vomiting   Cirrhosis of liver (HCC)   Hypothyroidism   Depression with anxiety   Paroxysmal atrial fibrillation (HCC)  Resolved Problems:   * No resolved hospital problems. *  Hospital Course: 56 year old woman PMH autoimmune hepatitis with cirrhosis, paroxysmal atrial fibrillation not on anticoagulation presented with lethargy, nausea, vomiting, inability to tolerate lactulose, slurred speech and confusion (associated with hyperammonemia).  Admitted for acute hepatic encephalopathy secondary to inability to tolerate lactulose, nausea and vomiting.  Lactulose and rifaximin resumed.  Treated with lactulose and rifaximin with gradual clinical improvement.  Hospitalization uncomplicated.  * Acute metabolic encephalopathy secondary to hepatic encephalopathy -- Patient is back to baseline at this point.  Continue rifaximin, lactulose.  Serum ammonia only mildly elevated, stable.   Nausea and vomiting --Several day history of nausea and vomiting led to intolerance of lactulose with led to this bout of hepatic encephalopathy  --Resolved.   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 --Continue home regimen of azathioprine --Continue outpatient follow-up with [facility] (last seen 05/2021)   Hypothyroidism --Continue Synthroid   Depression with anxiety --Continue home regimen of psychotropic therapy   Paroxysmal atrial fibrillation (HCC) --Currently rate controlled  without AV nodal blocking therapy. Not on anticoagulation due to underlying coagulopathy due to liver disease  Note is made of abnormal chest x-ray on admission, no signs or symptoms of infection or significant volume overload.  Not felt to be clinically significant.      Consultants:  None  Procedures performed:  None    Disposition: Home Diet recommendation:  Discharge Diet Orders (From admission, onward)     Start     Ordered   01/01/22 0000  Diet - low sodium heart healthy        01/01/22 1155           Cardiac diet DISCHARGE MEDICATION: Allergies as of 01/01/2022       Reactions   Aspirin Hives   Propoxyphene Anaphylaxis   Tramadol Anaphylaxis   Morphine And Related Nausea And Vomiting   Tylenol [acetaminophen] Other (See Comments)   Was told not to take due to liver   Prednisone Rash        Medication List     STOP taking these medications    acetaminophen 500 MG tablet Commonly known as: TYLENOL       TAKE these medications    azaTHIOprine 50 MG tablet Commonly known as: IMURAN Take 100 mg by mouth daily in the afternoon.   escitalopram 20 MG tablet Commonly known as: LEXAPRO Take 20 mg by mouth daily in the afternoon.   ferrous sulfate 325 (65 FE) MG tablet Take 325 mg by mouth daily in the afternoon.   IMMUNE FORMULA PO Take 2 tablets by mouth daily in the afternoon. Immune Plus   L-CARNITINE PO Take 1 tablet by mouth daily in the afternoon.   lactulose 10 GM/15ML solution Commonly known as: CHRONULAC Take 45 mLs (30 g total) by mouth 3 (three) times daily.   levothyroxine 100 MCG tablet Commonly known as: SYNTHROID  Take 100 mcg by mouth daily at 6 (six) AM.   Lubricant Eye Drops 0.4-0.3 % Soln Generic drug: Polyethyl Glycol-Propyl Glycol Place 1-2 drops into both eyes 3 (three) times daily as needed (dry/irritated eyes).   ondansetron 4 MG disintegrating tablet Commonly known as: ZOFRAN-ODT Take 1 tablet (4 mg total) by  mouth every 8 (eight) hours as needed for nausea or vomiting.   primidone 50 MG tablet Commonly known as: MYSOLINE Take 50 mg by mouth daily in the afternoon.   rifaximin 550 MG Tabs tablet Commonly known as: XIFAXAN Take 1 tablet (550 mg total) by mouth 2 (two) times daily.   traZODone 50 MG tablet Commonly known as: DESYREL Take 50 mg by mouth at bedtime.   Zinc 100 MG Tabs Take 200 mg by mouth daily in the afternoon.        Follow-up Information     Hamrick, Durward Fortes, MD Follow up.   Specialty: Family Medicine Why: As needed Contact information: [address] [address] [phone]         Jodelle Red, MD .   Specialty: Cardiology Contact information: [address] [place] [address] [phone]                Feels better Tolerating diet  Discharge Exam: Filed Weights   12/30/21 2021 12/31/21 0451  Weight: 130.2 kg 130.6 kg   Physical Exam Vitals reviewed.  Constitutional:      General: She is not in acute distress.    Appearance: She is not ill-appearing or toxic-appearing.  Cardiovascular:     Rate and Rhythm: Normal rate and regular rhythm.     Heart sounds: No murmur heard. Pulmonary:     Effort: Pulmonary effort is normal. No respiratory distress.     Breath sounds: No wheezing, rhonchi or rales.  Abdominal:     Palpations: Abdomen is soft.  Neurological:     Mental Status: She is alert and oriented to person, place, and time.     Comments: Speech fluent and clear  Psychiatric:        Mood and Affect: Mood normal.        Behavior: Behavior normal.        Thought Content: Thought content normal.   Ammonia 58   Condition at discharge: good  The results of significant diagnostics from this hospitalization (including imaging, microbiology, ancillary and laboratory) are listed below for reference.   Imaging Studies: DG Chest 1 View  Result Date: 12/31/2021 CLINICAL DATA:  Pneumonia. EXAM:  CHEST  1 VIEW COMPARISON:  November 04, 2021. FINDINGS: The heart size and mediastinal contours are within normal limits. Mildly increased bilateral perihilar and basilar interstitial densities are noted concerning for edema or possibly atypical inflammation. The visualized skeletal structures are unremarkable. IMPRESSION: Mildly increased bilateral interstitial densities are noted concerning for edema or possibly atypical inflammation. Electronically Signed   By: Lupita Raider M.D.   On: 12/31/2021 08:04   CT Head Wo Contrast  Result Date: 12/30/2021 CLINICAL DATA:  Slurred speech EXAM: CT HEAD WITHOUT CONTRAST TECHNIQUE: Contiguous axial images were obtained from the base of the skull through the vertex without intravenous contrast. RADIATION DOSE REDUCTION: This exam was performed according to the departmental dose-optimization program which includes automated exposure control, adjustment of the mA and/or kV according to patient size and/or use of iterative reconstruction technique. COMPARISON:  MR head 04/11/2021 and CT head 04/11/2021 FINDINGS: Brain: No intracranial hemorrhage, mass effect, or evidence  of acute infarct. No hydrocephalus. No extra-axial fluid collection. Vascular: No hyperdense vessel or unexpected calcification. Skull: No fracture or focal lesion. Sinuses/Orbits: Moderate mucosal thickening left maxillary sinus. Opacification of a few left mastoid air cells. The right mastoid air cells and remaining paranasal sinuses are well aerated. Other: None. IMPRESSION: Unremarkable noncontrast CT of the head. Electronically Signed   By: Minerva Fester M.D.   On: 12/30/2021 21:53    Microbiology: Results for orders placed or performed during the hospital encounter of 12/30/21  SARS Coronavirus 2 by RT PCR (hospital order, performed in [facility] hospital lab) *cepheid single result test* Anterior Nasal Swab     Status: None   Collection Time: 12/31/21  5:20 AM   Specimen: Anterior Nasal Swab   Result Value Ref Range Status   SARS Coronavirus 2 by RT PCR NEGATIVE NEGATIVE Final    Comment: (NOTE) SARS-CoV-2 target nucleic acids are NOT DETECTED.  The SARS-CoV-2 RNA is generally detectable in upper and lower respiratory specimens during the acute phase of infection. The lowest concentration of SARS-CoV-2 viral copies this assay can detect is 250 copies / mL. A negative result does not preclude SARS-CoV-2 infection and should not be used as the sole basis for treatment or other patient management decisions.  A negative result may occur with improper specimen collection / handling, submission of specimen other than nasopharyngeal swab, presence of viral mutation(s) within the areas targeted by this assay, and inadequate number of viral copies (<250 copies / mL). A negative result must be combined with clinical observations, patient history, and epidemiological information.  Fact Sheet for Patients:   RoadLapTop.co.za  Fact Sheet for Healthcare Providers: http://kim-miller.com/  This test is not yet approved or  cleared by the [place] FDA and has been authorized for detection and/or diagnosis of SARS-CoV-2 by FDA under an Emergency Use Authorization (EUA).  This EUA will remain in effect (meaning this test can be used) for the duration of the COVID-19 declaration under Section 564(b)(1) of the Act, 21 U.S.C. section 360bbb-3(b)(1), unless the authorization is terminated or revoked sooner.  Performed at [facility], [address], [place], [place] [address]     Labs: CBC: Recent Labs  Lab 12/30/21 2022 12/31/21 0828  WBC 4.2 2.4*  NEUTROABS 2.8 1.3*  HGB 12.7 10.6*  HCT 37.9 32.5*  MCV 99.0 100.3*  PLT 100* 71*   Basic Metabolic Panel: Recent Labs  Lab 12/30/21 2022 12/31/21 0828  NA 140 143  K 3.9 3.8  CL 113* 113*  CO2 22 26  GLUCOSE 135* 93  BUN 12 14  CREATININE 1.01* 0.82   CALCIUM 8.7* 8.1*  MG  --  1.9   Liver Function Tests: Recent Labs  Lab 12/30/21 2022 12/31/21 0828  AST 80* 64*  ALT 49* 40  ALKPHOS 90 70  BILITOT 1.7* 1.7*  PROT 6.8 5.6*  ALBUMIN 2.9* 2.3*   CBG: No results for input(s): "GLUCAP" in the last 168 hours.  Discharge time spent: less than 30 minutes.  Signed: Brendia Sacks, MD [facility] 01/01/2022

## 2022-01-01 NOTE — Evaluation (Signed)
Physical Therapy Evaluation Patient Details Name: Connie Larsen MRN: [id number] DOB: 1966-04-17 Today's Date: 01/01/2022  History of Present Illness  Pt admitted from home with N/V and dx with acute metabolic enchephalopathy 2* hepatic encephalopathy.  Pt with hx of PTSD, memory loss, anemia, autoimmune hepatitis with cirrhosis of liver, and R ankle fx (earlier this year)  Clinical Impression  Pt admitted as above and presenting with functional mobility limitations 2* generalized LE weakness, decreased endurance, and ambulatory balance deficits.  This date pt up to ambulate in hall with marked improvement in pt stability and safety with use of RW.  Pt would benefit from use of RW at home and from follow up HHPT to further address balance and strength deficits.     Recommendations for follow up therapy are one component of a multi-disciplinary discharge planning process, led by the attending physician.  Recommendations may be updated based on patient status, additional functional criteria and insurance authorization.  Follow Up Recommendations Home health PT      Assistance Recommended at Discharge Intermittent Supervision/Assistance  Patient can return home with the following  A little help with walking and/or transfers;A little help with bathing/dressing/bathroom;Help with stairs or ramp for entrance;Assist for transportation;Assistance with cooking/housework    Equipment Recommendations Rolling walker (2 wheels) (wide RW please)  Recommendations for Other Services       Functional Status Assessment Patient has had a recent decline in their functional status and demonstrates the ability to make significant improvements in function in a reasonable and predictable amount of time.     Precautions / Restrictions Precautions Precautions: Fall Restrictions Weight Bearing Restrictions: No      Mobility  Bed Mobility Overal bed mobility: Modified Independent              General bed mobility comments: no physical assist supine<>sit    Transfers Overall transfer level: Needs assistance Equipment used: None, Rolling walker (2 wheels) Transfers: Sit to/from Stand Sit to Stand: Min guard, Supervision           General transfer comment: min guard steady assist without RW and supervision with cues for use of UEs with use of RW    Ambulation/Gait Ambulation/Gait assistance: Min assist, Min guard, Supervision Gait Distance (Feet): 300 Feet Assistive device: Rolling walker (2 wheels), None Gait Pattern/deviations: Step-through pattern, Decreased step length - right, Decreased step length - left, Shuffle, Trunk flexed, Antalgic Gait velocity: decr     General Gait Details: Pt ambulating sans AD with antalgic gait, decreased pace, min knee flexion and noted instability.  With use of RW, noted increased stability, increased ease of movement and more appropriate pace for age.  Stairs            Wheelchair Mobility    Modified Rankin (Stroke Patients Only)       Balance Overall balance assessment: Needs assistance Sitting-balance support: No upper extremity supported, Feet supported Sitting balance-Leahy Scale: Good     Standing balance support: No upper extremity supported Standing balance-Leahy Scale: Fair                               Pertinent Vitals/Pain Pain Assessment Pain Assessment: No/denies pain    Home Living Family/patient expects to be discharged to:: Private residence Living Arrangements: Alone Available Help at Discharge: Family Type of Home: House Home Access: Stairs to enter Entrance Stairs-Rails: Right Entrance Stairs-Number of Steps: 2   Home Layout: One  level Home Equipment: Cane - single point;Crutches      Prior Function Prior Level of Function : Independent/Modified Independent             Mobility Comments: Pt reports using furniture around the house to stabilize       Hand  Dominance        Extremity/Trunk Assessment   Upper Extremity Assessment Upper Extremity Assessment: Overall WFL for tasks assessed    Lower Extremity Assessment Lower Extremity Assessment: Generalized weakness    Cervical / Trunk Assessment Cervical / Trunk Assessment: Normal  Communication   Communication: No difficulties  Cognition Arousal/Alertness: Awake/alert Behavior During Therapy: WFL for tasks assessed/performed Overall Cognitive Status: Within Functional Limits for tasks assessed                                          General Comments      Exercises     Assessment/Plan    PT Assessment Patient needs continued PT services  PT Problem List Decreased strength;Decreased activity tolerance;Decreased balance;Decreased mobility;Decreased knowledge of use of DME;Obesity       PT Treatment Interventions DME instruction;Gait training;Stair training;Functional mobility training;Therapeutic activities;Therapeutic exercise;Balance training;Patient/family education    PT Goals (Current goals can be found in the Care Plan section)  Acute Rehab PT Goals Patient Stated Goal: Improve balance and strength to prevent future falls PT Goal Formulation: With patient Time For Goal Achievement: 01/08/22 Potential to Achieve Goals: Good    Frequency Min 3X/week     Co-evaluation               AM-PAC PT "6 Clicks" Mobility  Outcome Measure Help needed turning from your back to your side while in a flat bed without using bedrails?: None Help needed moving from lying on your back to sitting on the side of a flat bed without using bedrails?: None Help needed moving to and from a bed to a chair (including a wheelchair)?: A Little Help needed standing up from a chair using your arms (e.g., wheelchair or bedside chair)?: A Little Help needed to walk in hospital room?: A Little Help needed climbing 3-5 steps with a railing? : A Lot 6 Click Score: 19     End of Session Equipment Utilized During Treatment: Gait belt Activity Tolerance: Patient tolerated treatment well Patient left: in bed;with call bell/phone within reach Nurse Communication: Mobility status PT Visit Diagnosis: Unsteadiness on feet (R26.81);Muscle weakness (generalized) (M62.81);Difficulty in walking, not elsewhere classified (R26.2);History of falling (Z91.81)    Time: 1610-9604 PT Time Calculation (min) (ACUTE ONLY): 19 min   Charges:   PT Evaluation $PT Eval Low Complexity: 1 Low          Mauro Kaufmann PT Acute Rehabilitation Services Pager [phone] Office [phone]   Daphane Odekirk 01/01/2022, 1:22 PM

## 2022-01-02 ENCOUNTER — Ambulatory Visit: Payer: Self-pay

## 2022-01-02 NOTE — Patient Outreach (Signed)
  Care Coordination TOC Note Transition Care Management Follow-up Telephone Call Date of discharge and from where: [facility] 12/30/21-01/01/22 How have you been since you were released from the hospital? "I feel a lot better!" Any questions or concerns? No  Items Reviewed: Did the pt receive and understand the discharge instructions provided? Yes  Medications obtained and verified? Yes  Other? No  Any new allergies since your discharge? No  Dietary orders reviewed? Yes Do you have support at home? Yes   Home Care and Equipment/Supplies: Were home health services ordered? no If so, what is the name of the agency? N/A  Has the agency set up a time to come to the patient's home? not applicable Were any new equipment or medical supplies ordered?  No What is the name of the medical supply agency? N/A Were you able to get the supplies/equipment? not applicable Do you have any questions related to the use of the equipment or supplies? No  Functional Questionnaire: (I = Independent and D = Dependent) ADLs: I  Bathing/Dressing- I  Meal Prep- I  Eating- I  Maintaining continence- I  Transferring/Ambulation- I  Managing Meds- I  Follow up appointments reviewed:  PCP Hospital f/u appt confirmed? No   Specialist Hospital f/u appt confirmed? No   Are transportation arrangements needed? No  If their condition worsens, is the pt aware to call PCP or go to the Emergency Dept.? Yes Was the patient provided with contact information for the PCP's office or ED? Yes Was to pt encouraged to call back with questions or concerns? Yes  SDOH assessments and interventions completed:   Yes  Care Coordination Interventions Activated:  Yes   Care Coordination Interventions:  PCP follow up appointment requested    Encounter Outcome:  Pt. Visit Completed

## 2022-01-07 ENCOUNTER — Ambulatory Visit: Payer: Medicare Other | Attending: Family Medicine

## 2022-01-21 ENCOUNTER — Other Ambulatory Visit: Payer: Medicare Other

## 2022-01-28 DIAGNOSIS — N611 Abscess of the breast and nipple: Secondary | ICD-10-CM | POA: Diagnosis not present

## 2022-01-28 DIAGNOSIS — R519 Headache, unspecified: Secondary | ICD-10-CM | POA: Diagnosis not present

## 2022-01-28 DIAGNOSIS — H1133 Conjunctival hemorrhage, bilateral: Secondary | ICD-10-CM | POA: Diagnosis not present

## 2022-02-09 ENCOUNTER — Ambulatory Visit (HOSPITAL_BASED_OUTPATIENT_CLINIC_OR_DEPARTMENT_OTHER): Payer: Medicare Other | Admitting: Cardiology

## 2022-02-10 ENCOUNTER — Ambulatory Visit: Payer: Medicare Other

## 2022-02-16 ENCOUNTER — Observation Stay (HOSPITAL_COMMUNITY): Payer: Medicare Other

## 2022-02-16 ENCOUNTER — Other Ambulatory Visit: Payer: Self-pay

## 2022-02-16 ENCOUNTER — Inpatient Hospital Stay (HOSPITAL_COMMUNITY)
Admission: EM | Admit: 2022-02-16 | Discharge: 2022-02-18 | DRG: 643 | Disposition: A | Discharge status: Home / Self Care | Payer: Medicare Other | Attending: Family Medicine | Admitting: Family Medicine

## 2022-02-16 ENCOUNTER — Encounter (HOSPITAL_COMMUNITY): Payer: Self-pay

## 2022-02-16 ENCOUNTER — Emergency Department (HOSPITAL_COMMUNITY): Payer: Medicare Other

## 2022-02-16 DIAGNOSIS — Z6841 Body Mass Index (BMI) 40.0 and over, adult: Secondary | ICD-10-CM

## 2022-02-16 DIAGNOSIS — E039 Hypothyroidism, unspecified: Secondary | ICD-10-CM | POA: Diagnosis not present

## 2022-02-16 DIAGNOSIS — G4730 Sleep apnea, unspecified: Secondary | ICD-10-CM | POA: Diagnosis present

## 2022-02-16 DIAGNOSIS — K754 Autoimmune hepatitis: Secondary | ICD-10-CM

## 2022-02-16 DIAGNOSIS — K7290 Hepatic failure, unspecified without coma: Secondary | ICD-10-CM | POA: Diagnosis not present

## 2022-02-16 DIAGNOSIS — F418 Other specified anxiety disorders: Secondary | ICD-10-CM | POA: Diagnosis not present

## 2022-02-16 DIAGNOSIS — E860 Dehydration: Secondary | ICD-10-CM | POA: Diagnosis not present

## 2022-02-16 DIAGNOSIS — Z8249 Family history of ischemic heart disease and other diseases of the circulatory system: Secondary | ICD-10-CM

## 2022-02-16 DIAGNOSIS — K7469 Other cirrhosis of liver: Secondary | ICD-10-CM | POA: Diagnosis not present

## 2022-02-16 DIAGNOSIS — N281 Cyst of kidney, acquired: Secondary | ICD-10-CM | POA: Diagnosis not present

## 2022-02-16 DIAGNOSIS — Z79899 Other long term (current) drug therapy: Secondary | ICD-10-CM | POA: Diagnosis not present

## 2022-02-16 DIAGNOSIS — N179 Acute kidney failure, unspecified: Secondary | ICD-10-CM | POA: Diagnosis not present

## 2022-02-16 DIAGNOSIS — Z23 Encounter for immunization: Secondary | ICD-10-CM

## 2022-02-16 DIAGNOSIS — E876 Hypokalemia: Secondary | ICD-10-CM | POA: Diagnosis not present

## 2022-02-16 DIAGNOSIS — G9341 Metabolic encephalopathy: Secondary | ICD-10-CM | POA: Diagnosis not present

## 2022-02-16 DIAGNOSIS — D696 Thrombocytopenia, unspecified: Secondary | ICD-10-CM

## 2022-02-16 DIAGNOSIS — K7682 Hepatic encephalopathy: Secondary | ICD-10-CM | POA: Diagnosis present

## 2022-02-16 DIAGNOSIS — Z888 Allergy status to other drugs, medicaments and biological substances status: Secondary | ICD-10-CM

## 2022-02-16 DIAGNOSIS — I480 Paroxysmal atrial fibrillation: Secondary | ICD-10-CM | POA: Diagnosis not present

## 2022-02-16 DIAGNOSIS — F4310 Post-traumatic stress disorder, unspecified: Secondary | ICD-10-CM | POA: Diagnosis present

## 2022-02-16 DIAGNOSIS — K7460 Unspecified cirrhosis of liver: Secondary | ICD-10-CM | POA: Diagnosis not present

## 2022-02-16 DIAGNOSIS — Z91141 Patient's other noncompliance with medication regimen due to financial hardship: Secondary | ICD-10-CM

## 2022-02-16 DIAGNOSIS — Z5986 Financial insecurity: Secondary | ICD-10-CM | POA: Diagnosis not present

## 2022-02-16 DIAGNOSIS — Z79890 Hormone replacement therapy (postmenopausal): Secondary | ICD-10-CM

## 2022-02-16 DIAGNOSIS — Z885 Allergy status to narcotic agent status: Secondary | ICD-10-CM

## 2022-02-16 DIAGNOSIS — K766 Portal hypertension: Secondary | ICD-10-CM | POA: Diagnosis present

## 2022-02-16 DIAGNOSIS — F32A Depression, unspecified: Secondary | ICD-10-CM | POA: Diagnosis not present

## 2022-02-16 LAB — CBC WITH DIFFERENTIAL/PLATELET
Abs Immature Granulocytes: 0 10*3/uL (ref 0.00–0.07)
Basophils Absolute: 0 10*3/uL (ref 0.0–0.1)
Basophils Relative: 1 %
Eosinophils Absolute: 0.2 10*3/uL (ref 0.0–0.5)
Eosinophils Relative: 7 %
HCT: 39.7 % (ref 36.0–46.0)
Hemoglobin: 13.1 g/dL (ref 12.0–15.0)
Immature Granulocytes: 0 %
Lymphocytes Relative: 19 %
Lymphs Abs: 0.6 10*3/uL — ABNORMAL LOW (ref 0.7–4.0)
MCH: 31.1 pg (ref 26.0–34.0)
MCHC: 33 g/dL (ref 30.0–36.0)
MCV: 94.3 fL (ref 80.0–100.0)
Monocytes Absolute: 0.3 10*3/uL (ref 0.1–1.0)
Monocytes Relative: 8 %
Neutro Abs: 2.2 10*3/uL (ref 1.7–7.7)
Neutrophils Relative %: 65 %
Platelets: 95 10*3/uL — ABNORMAL LOW (ref 150–400)
RBC: 4.21 MIL/uL (ref 3.87–5.11)
RDW: 14.2 % (ref 11.5–15.5)
WBC: 3.4 10*3/uL — ABNORMAL LOW (ref 4.0–10.5)
nRBC: 0 % (ref 0.0–0.2)

## 2022-02-16 LAB — COMPREHENSIVE METABOLIC PANEL
ALT: 37 U/L (ref 0–44)
AST: 62 U/L — ABNORMAL HIGH (ref 15–41)
Albumin: 2.8 g/dL — ABNORMAL LOW (ref 3.5–5.0)
Alkaline Phosphatase: 69 U/L (ref 38–126)
Anion gap: 6 (ref 5–15)
BUN: 11 mg/dL (ref 6–20)
CO2: 24 mmol/L (ref 22–32)
Calcium: 8.6 mg/dL — ABNORMAL LOW (ref 8.9–10.3)
Chloride: 111 mmol/L (ref 98–111)
Creatinine, Ser: 1.1 mg/dL — ABNORMAL HIGH (ref 0.44–1.00)
GFR, Estimated: 60 mL/min — ABNORMAL LOW (ref >60)
Glucose, Bld: 99 mg/dL (ref 70–99)
Potassium: 3.3 mmol/L — ABNORMAL LOW (ref 3.5–5.1)
Sodium: 141 mmol/L (ref 135–145)
Total Bilirubin: 2.1 mg/dL — ABNORMAL HIGH (ref 0.3–1.2)
Total Protein: 7 g/dL (ref 6.5–8.1)

## 2022-02-16 LAB — URINALYSIS, ROUTINE W REFLEX MICROSCOPIC
Bilirubin Urine: NEGATIVE
Glucose, UA: NEGATIVE mg/dL
Hgb urine dipstick: NEGATIVE
Ketones, ur: NEGATIVE mg/dL
Nitrite: NEGATIVE
Protein, ur: NEGATIVE mg/dL
Specific Gravity, Urine: 1.011 (ref 1.005–1.030)
pH: 5 (ref 5.0–8.0)

## 2022-02-16 LAB — ETHANOL: Alcohol, Ethyl (B): <10 mg/dL (ref <10)

## 2022-02-16 LAB — CBG MONITORING, ED: Glucose-Capillary: 95 mg/dL (ref 70–99)

## 2022-02-16 LAB — MAGNESIUM: Magnesium: 1.9 mg/dL (ref 1.7–2.4)

## 2022-02-16 LAB — AMMONIA: Ammonia: 131 umol/L — ABNORMAL HIGH (ref 9–35)

## 2022-02-16 LAB — PROTIME-INR
INR: 1.6 — ABNORMAL HIGH (ref 0.8–1.2)
Prothrombin Time: 19.3 seconds — ABNORMAL HIGH (ref 11.4–15.2)

## 2022-02-16 LAB — TSH: TSH: 94.462 u[IU]/mL — ABNORMAL HIGH (ref 0.350–4.500)

## 2022-02-16 MED ORDER — PROCHLORPERAZINE EDISYLATE 10 MG/2ML IJ SOLN
10.0000 mg | Freq: Four times a day (QID) | INTRAMUSCULAR | Status: DC | PRN
  Filled 2022-02-16: qty 2

## 2022-02-16 MED ORDER — ONDANSETRON HCL 4 MG/2ML IJ SOLN
4.0000 mg | Freq: Once | INTRAMUSCULAR | Status: AC
  Administered 2022-02-16: 4 mg via INTRAVENOUS
  Filled 2022-02-16: qty 2

## 2022-02-16 MED ORDER — RIFAXIMIN 550 MG PO TABS
550.0000 mg | ORAL_TABLET | Freq: Two times a day (BID) | ORAL | Status: DC
  Filled 2022-02-16: qty 1

## 2022-02-16 MED ORDER — POTASSIUM CHLORIDE CRYS ER 20 MEQ PO TBCR
40.0000 meq | EXTENDED_RELEASE_TABLET | Freq: Every day | ORAL | Status: DC
  Administered 2022-02-16 – 2022-02-18 (×3): 40 meq via ORAL
  Filled 2022-02-16 (×3): qty 2

## 2022-02-16 MED ORDER — LACTATED RINGERS IV BOLUS
1000.0000 mL | Freq: Once | INTRAVENOUS | Status: AC
  Administered 2022-02-16: 1000 mL via INTRAVENOUS

## 2022-02-16 MED ORDER — LACTULOSE 10 GM/15ML PO SOLN
30.0000 g | Freq: Three times a day (TID) | ORAL | Status: DC
  Administered 2022-02-16 – 2022-02-18 (×7): 30 g via ORAL
  Filled 2022-02-16 (×2): qty 45
  Filled 2022-02-16: qty 60
  Filled 2022-02-16: qty 45
  Filled 2022-02-16: qty 60
  Filled 2022-02-16 (×2): qty 45

## 2022-02-16 NOTE — ED Triage Notes (Signed)
Pt arrived via POV, with son. Per son pt was confused last night, refusing to come to hospital. He went to house this morning, found her to be more confused. Pt repeating her name in triage, able to follow commands but not answer questions. Pt lives alone, hx hepatic encephalopathy. Son states pt manages her own medications, unknown if she has been taking them.  CBG 95 in triage

## 2022-02-16 NOTE — ED Provider Notes (Addendum)
[facility]-EMERGENCY DEPT Provider Note   CSN: [id number] Arrival date & time: 02/16/22  0741     History  Chief Complaint  Patient presents with   Altered Mental Status    Connie Larsen is a 56 y.o. female.   Altered Mental Status Presenting symptoms: confusion   Associated symptoms: nausea and vomiting   Patient presents for altered mental status. Her medical history includes cirrhosis, hypothyroidism, depression, paroxysmal atrial fibrillation, sleep apnea, arthritis, anemia, and prior episodes of hepatic encephalopathy. History is provided by her son.  Patient lives independently and does manage her own medications.  She was last seen well 2 days ago.  Yesterday, she had a nausea and vomiting.  When her son went to check on her, she seemed confused.  She refused coming to the hospital yesterday and stated that she just wanted to sleep.  Patient was allowed to sleep overnight.  When her son checked on her again today, she was more confused.  He describes confusion as word finding difficulty, repetitive statements, nonsensical speech.  When she was told to get dressed, she went into the kitchen and was trying to use the microwave.  Patient currently denies any areas of discomfort.  She does endorse current nausea.     Home Medications Prior to Admission medications   Medication Sig Start Date End Date Taking? Authorizing Provider  azaTHIOprine (IMURAN) 50 MG tablet Take 100 mg by mouth daily in the afternoon. 04/18/21   [provider]  escitalopram (LEXAPRO) 20 MG tablet Take 20 mg by mouth daily in the afternoon. 10/19/20   [provider]  ferrous sulfate 325 (65 FE) MG tablet Take 325 mg by mouth daily in the afternoon. 07/04/20   [provider]  lactulose (CHRONULAC) 10 GM/15ML solution Take 45 mLs (30 g total) by mouth 3 (three) times daily. Patient not taking: Reported on 12/30/2021 11/06/21   Dimple Nanas, MD  levOCARNitine  (L-CARNITINE PO) Take 1 tablet by mouth daily in the afternoon.    [provider]  levothyroxine (SYNTHROID) 100 MCG tablet Take 100 mcg by mouth daily at 6 (six) AM. 05/31/21   [provider]  Misc Natural Products (IMMUNE FORMULA PO) Take 2 tablets by mouth daily in the afternoon. Immune Plus    [provider]  ondansetron (ZOFRAN-ODT) 4 MG disintegrating tablet Take 1 tablet (4 mg total) by mouth every 8 (eight) hours as needed for nausea or vomiting. Patient not taking: Reported on 12/30/2021 11/06/21   Dimple Nanas, MD  Polyethyl Glycol-Propyl Glycol (LUBRICANT EYE DROPS) 0.4-0.3 % SOLN Place 1-2 drops into both eyes 3 (three) times daily as needed (dry/irritated eyes).    [provider]  primidone (MYSOLINE) 50 MG tablet Take 50 mg by mouth daily in the afternoon. 04/19/21   [provider]  rifaximin (XIFAXAN) 550 MG TABS tablet Take 1 tablet (550 mg total) by mouth 2 (two) times daily. 04/15/21   Burnadette Pop, MD  traZODone (DESYREL) 50 MG tablet Take 50 mg by mouth at bedtime.    [provider]  Zinc 100 MG TABS Take 200 mg by mouth daily in the afternoon.    [provider]      Allergies    Aspirin, Propoxyphene, Tramadol, Morphine and related, Tylenol [acetaminophen], and Prednisone    Review of Systems   Review of Systems  Unable to perform ROS: Mental status change  Gastrointestinal:  Positive for nausea and vomiting.  Psychiatric/Behavioral:  Positive for confusion.     Physical Exam Updated Vital Signs BP 135/62   Pulse 87   Temp 98.6 F (37 C) (Oral)   Resp 18   SpO2 97%  Physical Exam Vitals and nursing note reviewed.  Constitutional:      General: She is not in acute distress.    Appearance: She is well-developed. She is ill-appearing (Chronically). She is not toxic-appearing or diaphoretic.  HENT:     Head: Normocephalic and atraumatic.     Right Ear: External ear normal.     Left Ear:  External ear normal.     Nose: Nose normal.     Mouth/Throat:     Mouth: Mucous membranes are dry.  Eyes:     Extraocular Movements: Extraocular movements intact.     Conjunctiva/sclera: Conjunctivae normal.  Cardiovascular:     Rate and Rhythm: Normal rate and regular rhythm.     Heart sounds: No murmur heard. Pulmonary:     Effort: Pulmonary effort is normal. No respiratory distress.     Breath sounds: Normal breath sounds. No wheezing or rales.  Abdominal:     General: There is no distension.     Palpations: Abdomen is soft.     Tenderness: There is no abdominal tenderness.  Musculoskeletal:        General: No swelling.     Cervical back: Normal range of motion and neck supple.     Right lower leg: No edema.     Left lower leg: No edema.  Skin:    General: Skin is warm and dry.     Coloration: Skin is not jaundiced or pale.  Neurological:     General: No focal deficit present.     Mental Status: She is alert. She is disoriented.     Cranial Nerves: No cranial nerve deficit.     Motor: No weakness.     Comments: Oriented to self only.  Asterixis is present.  Psychiatric:        Mood and Affect: Mood normal.     ED Results / Procedures / Treatments   Labs (all labs ordered are listed, but only abnormal results are displayed) Labs Reviewed  COMPREHENSIVE METABOLIC PANEL - Abnormal; Notable for the following components:      Result Value   Potassium 3.3 (*)    Creatinine, Ser 1.10 (*)    Calcium 8.6 (*)    Albumin 2.8 (*)    AST 62 (*)    Total Bilirubin 2.1 (*)    GFR, Estimated 60 (*)    All other components within normal limits  AMMONIA - Abnormal; Notable for the following components:   Ammonia 131 (*)    All other components within normal limits  PROTIME-INR - Abnormal; Notable for the following components:   Prothrombin Time 19.3 (*)    INR 1.6 (*)    All other components within normal limits  MAGNESIUM  ETHANOL  CBC WITH DIFFERENTIAL/PLATELET   URINALYSIS, ROUTINE W REFLEX MICROSCOPIC  TSH  CBG MONITORING, ED    EKG None  Radiology CT HEAD WO CONTRAST  Result Date: 02/16/2022 CLINICAL DATA:  A 56 year old female presents with mental status changes of unknown cause. EXAM: CT HEAD WITHOUT CONTRAST TECHNIQUE: Contiguous axial images were obtained from the base of the skull through the vertex without intravenous contrast. RADIATION DOSE REDUCTION: This exam was performed according to the departmental dose-optimization program which includes automated exposure control, adjustment of the mA and/or kV according to  patient size and/or use of iterative reconstruction technique. COMPARISON:  December 31, 2018 and November of 2022 FINDINGS: Brain: No evidence of acute infarction, hemorrhage, hydrocephalus, extra-axial collection or mass lesion/mass effect. Vascular: No hyperdense vessel or unexpected calcification. Skull: Normal. Negative for fracture or focal lesion. Sinuses/Orbits: Patchy opacification of ethmoid sinuses with mild mucosal thickening of LEFT sphenoid sinus. Sinus is incompletely imaged as are the orbits and areas otherwise negative for acute findings. Small LEFT mastoid effusion unchanged from previous imaging. Other: None. IMPRESSION: 1. No acute intracranial abnormality. 2. Mild sinus disease. Electronically Signed   By: Zetta Bills M.D.   On: 02/16/2022 08:56    Procedures Procedures    Medications Ordered in ED Medications  rifaximin (XIFAXAN) tablet 550 mg (has no administration in time range)  lactulose (CHRONULAC) 10 GM/15ML solution 30 g (has no administration in time range)  lactated ringers bolus 1,000 mL (1,000 mLs Intravenous New Bag/Given 02/16/22 0858)  ondansetron (ZOFRAN) injection 4 mg (4 mg Intravenous Given 02/16/22 4166)    ED Course/ Medical Decision Making/ A&P                           Medical Decision Making Amount and/or Complexity of Data Reviewed Labs: ordered. Radiology:  ordered.  Risk Prescription drug management.   This patient presents to the ED for concern of altered mental status, this involves an extensive number of treatment options, and is a complaint that carries with it a high risk of complications and morbidity.  The differential diagnosis includes hepatic encephalopathy, ICH, CVA, polypharmacy, medication withdrawal, infection, hypoglycemia, other metabolic derangement   Co morbidities that complicate the patient evaluation  cirrhosis, hypothyroidism, depression, paroxysmal atrial fibrillation, sleep apnea, arthritis, anemia, and prior episodes of hepatic encephalopathy   Additional history obtained:  Additional history obtained from patient's son External records from outside source obtained and reviewed including EMR   Lab Tests:  I Ordered, and personally interpreted labs.  The pertinent results include: Normal kidney function, mild hypokalemia with otherwise normal electrolytes, baseline elevation in INR, elevated ammonia consistent with hepatic encephalopathy   Imaging Studies ordered:  I ordered imaging studies including CT head I independently visualized and interpreted imaging which showed no acute findings I agree with the radiologist interpretation   Cardiac Monitoring: / EKG:  The patient was maintained on a cardiac monitor.  I personally viewed and interpreted the cardiac monitored which showed an underlying rhythm of: Sinus rhythm  Problem List / ED Course / Critical interventions / Medication management  Patient presents for altered mental status.  She was last seen normal 2 days ago.  She had confusion, nausea, and vomiting yesterday.  She had worsened confusion today.  Her medical history includes cirrhosis and prior episodes of hepatic encephalopathy.  Although her son states that her current altered mental state is different than prior episodes of hepatic encephalopathy, patient does have asterixis present on exam.   She also has dry oral mucosa in the setting of recent nausea and vomiting.  Will provide IV fluids and initiate laboratory work-up.  Given her cirrhosis, she is coagulopathic and will obtain CT head.  CT head showed no acute findings.  Laboratory work-up is notable for an ammonia of 131.  This is further evidence of hepatic encephalopathy as the cause of her current condition.  Lactulose was ordered.  Patient was admitted to hospitalist for further management. I ordered medication including IV fluid for hydration; lactulose for hepatic  encephalopathy Reevaluation of the patient after these medicines showed that the patient improved I have reviewed the patients home medicines and have made adjustments as needed   Social Determinants of Health:  Lives independently, frequent hospital admissions  CRITICAL CARE Performed by: Gloris Manchester   Total critical care time: 32 minutes  Critical care time was exclusive of separately billable procedures and treating other patients.  Critical care was necessary to treat or prevent imminent or life-threatening deterioration.  Critical care was time spent personally by me on the following activities: development of treatment plan with patient and/or surrogate as well as nursing, discussions with consultants, evaluation of patient's response to treatment, examination of patient, obtaining history from patient or surrogate, ordering and performing treatments and interventions, ordering and review of laboratory studies, ordering and review of radiographic studies, pulse oximetry and re-evaluation of patient's condition.          Final Clinical Impression(s) / ED Diagnoses Final diagnoses:  Encephalopathy, hepatic [facility])    Rx / DC Orders ED Discharge Orders     None         Gloris Manchester, MD 02/16/22 4492    Gloris Manchester, MD 02/16/22 [phone]

## 2022-02-16 NOTE — H&P (Signed)
History and Physical    Patient: Connie Larsen RWE:[id number] DOB: 05-23-1965 DOA: 02/16/2022 DOS: the patient was seen and examined on 02/16/2022 PCP: HamrickLorin Mercy, MD  Patient coming from: Home  Chief Complaint:  Chief Complaint  Patient presents with   Altered Mental Status   HPI: Connie Larsen is a 56 y.o. female with medical history significant of cirrhosis of liver, depression, hypothyroidism, autoimmune hepatitis. Presenting with altered mental status. History is from son at bedside. He reports that she was in her normal state of health until yesterday. She complained of N/V. She didn't have any fevers, diarrhea, or sick contacts. She seemed confused, so he made her take twice her normal lactulose. When her confusion didn't clear this morning, he decided to bring her to the ED for evaluation.  Review of Systems: As mentioned in the history of present illness. All other systems reviewed and are negative. 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 was a child   ORIF ANKLE FRACTURE Right 08/02/2021   Procedure: OPEN REDUCTION INTERNAL FIXATION (ORIF) RIGHT DISTAL FIBULA, POSSIBLE SYNDESMOSIS;  Surgeon: Armond Hang, MD;  Location: [facility];  Service: Orthopedics;  Laterality: Right;  120 wants to follow 2nd case   TONSILLECTOMY     Social History:  reports that she has never smoked. She has never used smokeless tobacco. She reports that she does not drink alcohol and does not use drugs.  Allergies  Allergen  Reactions   Aspirin Hives   Propoxyphene Anaphylaxis   Tramadol Anaphylaxis   Morphine And Related Nausea And Vomiting   Tylenol [Acetaminophen] Other (See Comments)    Was told not to take due to liver   Prednisone Rash    Family History  Problem Relation Age of Onset   Heart disease Mother    Arrhythmia Father        pacemaker   Heart disease Sister    Arrhythmia Brother        pacemaker    Prior to Admission medications   Medication Sig Start Date End Date Taking? Authorizing Provider  azaTHIOprine (IMURAN) 50 MG tablet Take 100 mg by mouth daily in the afternoon. 04/18/21   [provider]  escitalopram (LEXAPRO) 20 MG tablet Take 20 mg by mouth daily in the afternoon. 10/19/20   [provider]  ferrous sulfate 325 (65 FE) MG tablet Take 325 mg by mouth daily in the afternoon. 07/04/20   [provider]  lactulose (CHRONULAC) 10 GM/15ML solution Take 45 mLs (30 g total) by mouth 3 (three) times daily. Patient not taking: Reported on 12/30/2021 11/06/21   Damita Lack, MD  levOCARNitine (L-CARNITINE PO) Take 1 tablet by mouth daily in the afternoon.    [provider]  levothyroxine (SYNTHROID) 100 MCG tablet Take 100 mcg by mouth daily at 6 (six) AM. 05/31/21  [provider]  Misc Natural Products (IMMUNE FORMULA PO) Take 2 tablets by mouth daily in the afternoon. Immune Plus    [provider]  ondansetron (ZOFRAN-ODT) 4 MG disintegrating tablet Take 1 tablet (4 mg total) by mouth every 8 (eight) hours as needed for nausea or vomiting. Patient not taking: Reported on 12/30/2021 11/06/21   Damita Lack, MD  Polyethyl Glycol-Propyl Glycol (LUBRICANT EYE DROPS) 0.4-0.3 % SOLN Place 1-2 drops into both eyes 3 (three) times daily as needed (dry/irritated eyes).    [provider]  primidone (MYSOLINE) 50 MG tablet Take 50 mg by mouth daily in the afternoon. 04/19/21   [provider]  rifaximin (XIFAXAN)  550 MG TABS tablet Take 1 tablet (550 mg total) by mouth 2 (two) times daily. 04/15/21   Shelly Coss, MD  traZODone (DESYREL) 50 MG tablet Take 50 mg by mouth at bedtime.    [provider]  Zinc 100 MG TABS Take 200 mg by mouth daily in the afternoon.    [provider]    Physical Exam: Vitals:   02/16/22 0744 02/16/22 0745  BP:  135/62  Pulse: 87   Resp: 18   Temp: 98.6 F (37 C)   TempSrc: Oral   SpO2: 97%    General: 56 y.o. female resting in bed in NAD Eyes: PERRL, normal sclera ENMT: Nares patent w/o discharge, orophaynx clear, dentition normal, ears w/o discharge/lesions/ulcers Neck: Supple, trachea midline Cardiovascular: RRR, +S1, S2, no m/g/r, equal pulses throughout Respiratory: CTABL, no w/r/r, normal WOB GI: BS+, obese, soft, NT, no masses noted, no organomegaly noted MSK: No e/c/c Neuro: A&O x 2, no focal deficits, slow to answer questions Psyc: flat affect, confused, but cooperative  Data Reviewed:  Results for orders placed or performed during the hospital encounter of 02/16/22 (from the past 24 hour(s))  CBG monitoring, ED     Status: None   Collection Time: 02/16/22  7:44 AM  Result Value Ref Range   Glucose-Capillary 95 70 - 99 mg/dL   Comment 1 Notify RN    Comment 2 Document in Chart   Comprehensive metabolic panel     Status: Abnormal   Collection Time: 02/16/22  8:35 AM  Result Value Ref Range   Sodium 141 135 - 145 mmol/L   Potassium 3.3 (L) 3.5 - 5.1 mmol/L   Chloride 111 98 - 111 mmol/L   CO2 24 22 - 32 mmol/L   Glucose, Bld 99 70 - 99 mg/dL   BUN 11 6 - 20 mg/dL   Creatinine, Ser 1.10 (H) 0.44 - 1.00 mg/dL   Calcium 8.6 (L) 8.9 - 10.3 mg/dL   Total Protein 7.0 6.5 - 8.1 g/dL   Albumin 2.8 (L) 3.5 - 5.0 g/dL   AST 62 (H) 15 - 41 U/L   ALT 37 0 - 44 U/L   Alkaline Phosphatase 69 38 - 126 U/L   Total Bilirubin 2.1 (H) 0.3 - 1.2 mg/dL   GFR, Estimated 60 (L) >60 mL/min   Anion gap 6 5 - 15  Magnesium     Status: None    Collection Time: 02/16/22  8:35 AM  Result Value Ref Range   Magnesium 1.9 1.7 - 2.4 mg/dL  Protime-INR     Status: Abnormal   Collection Time: 02/16/22  8:35 AM  Result Value Ref Range   Prothrombin Time 19.3 (H) 11.4 - 15.2 seconds   INR 1.6 (H) 0.8 - 1.2  Ammonia  Status: Abnormal   Collection Time: 02/16/22  8:37 AM  Result Value Ref Range   Ammonia 131 (H) 9 - 35 umol/L  Ethanol     Status: None   Collection Time: 02/16/22  8:37 AM  Result Value Ref Range   Alcohol, Ethyl (B) <10 <10 mg/dL   CTH: 1. No acute intracranial abnormality. 2. Mild sinus disease.  Assessment and Plan: Acute hepatic encephalopathy     - place in med-surg obs     - CTH ok     - lactulose  N/V     - anti-emetics     - fluids  AKI     - fluids     - renal US     - watch nephrotoxins  Hypokalemia     - replace K+; Mg2+ ok  Cirrhosis of liver Autoimmune hepatitis Elevated LFTs     - continue home regimen when confirmed  Thrombocytopenia     - secondary to liver disease     - SCDs  Morbid obesity     - counsel on diet/lifestyle changes     - follow up outpt  Depression Anxiety     - continue home regimen when confirmed  Hypothyroidism     - continue home regimen when confirmed  Advance Care Planning:   Code Status: FULL  Consults: None  Family Communication: w/ son at bedside  Severity of Illness: The appropriate patient status for this patient is OBSERVATION. Observation status is judged to be reasonable and necessary in order to provide the required intensity of service to ensure the patient's safety. The patient's presenting symptoms, physical exam findings, and initial radiographic and laboratory data in the context of their medical condition is felt to place them at decreased risk for further clinical deterioration. Furthermore, it is anticipated that the patient will be medically stable for discharge from the hospital within 2 midnights of admission.   Time spent  in coordination of this H&P: 48 minutes  Author: Jonnie Finner, DO 02/16/2022 9:27 AM  For on call review [email or web address].

## 2022-02-16 NOTE — Plan of Care (Signed)
  Problem: Activity: Goal: Risk for activity intolerance will decrease Outcome: Progressing   

## 2022-02-17 ENCOUNTER — Other Ambulatory Visit: Payer: Medicare Other

## 2022-02-17 DIAGNOSIS — Z79890 Hormone replacement therapy (postmenopausal): Secondary | ICD-10-CM | POA: Diagnosis not present

## 2022-02-17 DIAGNOSIS — I480 Paroxysmal atrial fibrillation: Secondary | ICD-10-CM | POA: Diagnosis not present

## 2022-02-17 DIAGNOSIS — K766 Portal hypertension: Secondary | ICD-10-CM | POA: Diagnosis present

## 2022-02-17 DIAGNOSIS — G4730 Sleep apnea, unspecified: Secondary | ICD-10-CM | POA: Diagnosis present

## 2022-02-17 DIAGNOSIS — E876 Hypokalemia: Secondary | ICD-10-CM | POA: Diagnosis present

## 2022-02-17 DIAGNOSIS — K754 Autoimmune hepatitis: Secondary | ICD-10-CM

## 2022-02-17 DIAGNOSIS — K7469 Other cirrhosis of liver: Secondary | ICD-10-CM

## 2022-02-17 DIAGNOSIS — Z888 Allergy status to other drugs, medicaments and biological substances status: Secondary | ICD-10-CM | POA: Diagnosis not present

## 2022-02-17 DIAGNOSIS — E860 Dehydration: Secondary | ICD-10-CM | POA: Diagnosis present

## 2022-02-17 DIAGNOSIS — F418 Other specified anxiety disorders: Secondary | ICD-10-CM

## 2022-02-17 DIAGNOSIS — Z91141 Patient's other noncompliance with medication regimen due to financial hardship: Secondary | ICD-10-CM | POA: Diagnosis not present

## 2022-02-17 DIAGNOSIS — Z23 Encounter for immunization: Secondary | ICD-10-CM | POA: Diagnosis not present

## 2022-02-17 DIAGNOSIS — E039 Hypothyroidism, unspecified: Secondary | ICD-10-CM

## 2022-02-17 DIAGNOSIS — Z79899 Other long term (current) drug therapy: Secondary | ICD-10-CM | POA: Diagnosis not present

## 2022-02-17 DIAGNOSIS — F32A Depression, unspecified: Secondary | ICD-10-CM | POA: Diagnosis present

## 2022-02-17 DIAGNOSIS — Z885 Allergy status to narcotic agent status: Secondary | ICD-10-CM | POA: Diagnosis not present

## 2022-02-17 DIAGNOSIS — G9341 Metabolic encephalopathy: Secondary | ICD-10-CM

## 2022-02-17 DIAGNOSIS — F4310 Post-traumatic stress disorder, unspecified: Secondary | ICD-10-CM | POA: Diagnosis present

## 2022-02-17 DIAGNOSIS — D696 Thrombocytopenia, unspecified: Secondary | ICD-10-CM | POA: Diagnosis present

## 2022-02-17 DIAGNOSIS — K7682 Hepatic encephalopathy: Secondary | ICD-10-CM | POA: Diagnosis present

## 2022-02-17 DIAGNOSIS — Z6841 Body Mass Index (BMI) 40.0 and over, adult: Secondary | ICD-10-CM | POA: Diagnosis not present

## 2022-02-17 DIAGNOSIS — K7460 Unspecified cirrhosis of liver: Secondary | ICD-10-CM | POA: Diagnosis present

## 2022-02-17 DIAGNOSIS — Z8249 Family history of ischemic heart disease and other diseases of the circulatory system: Secondary | ICD-10-CM | POA: Diagnosis not present

## 2022-02-17 DIAGNOSIS — Z5986 Financial insecurity: Secondary | ICD-10-CM | POA: Diagnosis not present

## 2022-02-17 LAB — COMPREHENSIVE METABOLIC PANEL
ALT: 29 U/L (ref 0–44)
AST: 51 U/L — ABNORMAL HIGH (ref 15–41)
Albumin: 2.2 g/dL — ABNORMAL LOW (ref 3.5–5.0)
Alkaline Phosphatase: 56 U/L (ref 38–126)
Anion gap: 5 (ref 5–15)
BUN: 13 mg/dL (ref 6–20)
CO2: 21 mmol/L — ABNORMAL LOW (ref 22–32)
Calcium: 8.1 mg/dL — ABNORMAL LOW (ref 8.9–10.3)
Chloride: 115 mmol/L — ABNORMAL HIGH (ref 98–111)
Creatinine, Ser: 1.08 mg/dL — ABNORMAL HIGH (ref 0.44–1.00)
GFR, Estimated: >60 mL/min (ref >60)
Glucose, Bld: 75 mg/dL (ref 70–99)
Potassium: 3.6 mmol/L (ref 3.5–5.1)
Sodium: 141 mmol/L (ref 135–145)
Total Bilirubin: 2.1 mg/dL — ABNORMAL HIGH (ref 0.3–1.2)
Total Protein: 5.5 g/dL — ABNORMAL LOW (ref 6.5–8.1)

## 2022-02-17 LAB — CORTISOL: Cortisol, Plasma: 7.9 ug/dL

## 2022-02-17 LAB — CBC
HCT: 32.9 % — ABNORMAL LOW (ref 36.0–46.0)
Hemoglobin: 11.1 g/dL — ABNORMAL LOW (ref 12.0–15.0)
MCH: 32.4 pg (ref 26.0–34.0)
MCHC: 33.7 g/dL (ref 30.0–36.0)
MCV: 95.9 fL (ref 80.0–100.0)
Platelets: 82 10*3/uL — ABNORMAL LOW (ref 150–400)
RBC: 3.43 MIL/uL — ABNORMAL LOW (ref 3.87–5.11)
RDW: 14.4 % (ref 11.5–15.5)
WBC: 3.2 10*3/uL — ABNORMAL LOW (ref 4.0–10.5)
nRBC: 0 % (ref 0.0–0.2)

## 2022-02-17 LAB — T4, FREE: Free T4: 0.48 ng/dL — ABNORMAL LOW (ref 0.61–1.12)

## 2022-02-17 MED ORDER — ESCITALOPRAM OXALATE 20 MG PO TABS
20.0000 mg | ORAL_TABLET | Freq: Every day | ORAL | Status: DC
  Administered 2022-02-17: 20 mg via ORAL
  Filled 2022-02-17: qty 1

## 2022-02-17 MED ORDER — RIFAXIMIN 550 MG PO TABS
550.0000 mg | ORAL_TABLET | Freq: Two times a day (BID) | ORAL | Status: DC
  Administered 2022-02-17 – 2022-02-18 (×3): 550 mg via ORAL
  Filled 2022-02-17 (×3): qty 1

## 2022-02-17 MED ORDER — INFLUENZA VAC SPLIT QUAD 0.5 ML IM SUSY
0.5000 mL | PREFILLED_SYRINGE | INTRAMUSCULAR | Status: AC
  Administered 2022-02-18: 0.5 mL via INTRAMUSCULAR
  Filled 2022-02-17: qty 0.5

## 2022-02-17 MED ORDER — ZINC 100 MG PO TABS: 200.0000 mg | ORAL_TABLET | Freq: Every day | ORAL | Status: DC

## 2022-02-17 MED ORDER — PNEUMOCOCCAL VAC POLYVALENT 25 MCG/0.5ML IJ INJ
0.5000 mL | INJECTION | INTRAMUSCULAR | Status: AC
  Administered 2022-02-18: 0.5 mL via INTRAMUSCULAR
  Filled 2022-02-17: qty 0.5

## 2022-02-17 MED ORDER — AZATHIOPRINE 50 MG PO TABS
100.0000 mg | ORAL_TABLET | Freq: Every day | ORAL | Status: DC
  Administered 2022-02-17: 100 mg via ORAL
  Filled 2022-02-17 (×2): qty 2

## 2022-02-17 MED ORDER — IBUPROFEN 400 MG PO TABS
200.0000 mg | ORAL_TABLET | Freq: Once | ORAL | Status: AC
  Administered 2022-02-17: 200 mg via ORAL
  Filled 2022-02-17: qty 1

## 2022-02-17 MED ORDER — LIOTHYRONINE SODIUM 5 MCG PO TABS
5.0000 ug | ORAL_TABLET | Freq: Every day | ORAL | Status: DC
  Administered 2022-02-17 – 2022-02-18 (×2): 5 ug via ORAL
  Filled 2022-02-17 (×2): qty 1

## 2022-02-17 MED ORDER — ZINC SULFATE 220 (50 ZN) MG PO CAPS
220.0000 mg | ORAL_CAPSULE | Freq: Every day | ORAL | Status: DC
  Administered 2022-02-17 – 2022-02-18 (×2): 220 mg via ORAL
  Filled 2022-02-17 (×2): qty 1

## 2022-02-17 MED ORDER — LEVOTHYROXINE SODIUM 100 MCG/5ML IV SOLN
100.0000 ug | Freq: Every day | INTRAVENOUS | Status: DC
  Administered 2022-02-17 – 2022-02-18 (×2): 100 ug via INTRAVENOUS
  Filled 2022-02-17 (×2): qty 5

## 2022-02-17 MED ORDER — L-CARNITINE 500 MG PO TABS: ORAL_TABLET | Freq: Every day | ORAL | Status: DC

## 2022-02-17 NOTE — Care Plan (Signed)
Updated daughter by phone.

## 2022-02-17 NOTE — Assessment & Plan Note (Signed)
TSH >80.  Has not taken levothyroxine for >1 month due to financial issues.  Has supply being delivered to home today she thinks.  No hypotension to suggest crisis. - Check cortisol, fT4, T3 - Give IV levothyroxine today - Give Cytomel

## 2022-02-17 NOTE — Hospital Course (Signed)
Connie Larsen is a 56 y.o. F with AIH and cirrhosis, pAF not on AC, depression, hypothyroidism,presented with hepatic encephalopathy.    10/2: Admitted on lactulose, fluids, anti-emetics

## 2022-02-17 NOTE — Assessment & Plan Note (Signed)
Patient with history of Afib, not on AC.  Currently rate controlled

## 2022-02-17 NOTE — Assessment & Plan Note (Signed)
Treated and resolved 

## 2022-02-17 NOTE — Assessment & Plan Note (Addendum)
Likely this is multifactorial from dehydration, acute hepatic encephalopathy and hypothyroidism.  AKI ruled out. - Resume rifaximin - Continue lactulose - IV levothyroxine and Cytomel

## 2022-02-17 NOTE — Assessment & Plan Note (Signed)
-   Continue azathioprine °

## 2022-02-17 NOTE — Progress Notes (Signed)
  Progress Note   Patient: Connie Larsen:[id number] DOB: [birth year] DOA: 02/16/2022     0 DOS: the patient was seen and examined on 02/17/2022 at 10:37 AM      Brief hospital course: Connie Larsen is a 56 y.o. F with AIH and cirrhosis, pAF not on AC, depression, hypothyroidism,presented with hepatic encephalopathy.    10/2: Admitted on lactulose, fluids, anti-emetics     Assessment and Plan: * Acute metabolic encephalopathy Likely this is multifactorial from dehydration, acute hepatic encephalopathy and hypothyroidism.  AKI ruled out. - Resume rifaximin - Continue lactulose - IV levothyroxine and Cytomel  Paroxysmal atrial fibrillation ([place]) Patient with history of Afib, not on AC.  Currently rate controlled  Autoimmune hepatitis ([place]) - Continue azathioprine  Morbid obesity (H[facility]) BMI 45  Thrombocytopenia ([facility]) Due to cirrhosis, no bleeidng.  Hypokalemia - Supplement potassium  Portal hypertension (HCC) No variceal bleeding  Depression with anxiety - Continue escitalopram  Hypothyroidism TSH >80.  Has not taken levothyroxine for >1 month due to financial issues.  Has supply being delivered to home today she thinks.  No hypotension to suggest crisis. - Check cortisol, fT4, T3 - Give IV levothyroxine today - Give Cytomel  Cirrhosis of liver (HCC)            Subjective: Patient still has some psychomotor slowing, confusion, malaise, fatigue.  She has no vomiting, no seizures, no no bleeding.     Physical Exam: BP 113/60 (BP Location: Left Arm)   Pulse 62   Temp 98.1 F (36.7 C) (Oral)   Resp 16   Ht 5\' 7"  (1.702 m)   Wt 131.8 kg   SpO2 99%   BMI 45.51 kg/m   Obese adult female, lying in bed, appears weak and tired, edentulous RRR, no murmurs, no peripheral edema Respiratory rate normal, lungs clear without rales or wheezes Abdomen soft, no distention or ascites, no tenderness palpation in all quadrants Attention diminished, affect  blunted, psychomotor slowing noted, no asterixis, speech fluent, face symmetric, severe generalized weakness    Data Reviewed: Basic metabolic panel shows resolved hypokalemia Sodium and creatinine normal TSH 94 Pancytopenia noted Renal ultrasound normal CT of the head unremarkable    Family Communication: None present    Disposition: Status is: Observation The patient was admitted with acute metabolic encephalopathy, she continues to have some confusion and psychomotor slowing, so we will get continued lactulose to resolve her hyperammonemia, and also start IV thyroid supplement        Author: Edwin Dada, MD 02/17/2022 2:00 PM  For on call review [email or web address].

## 2022-02-17 NOTE — Assessment & Plan Note (Signed)
BMI 45 

## 2022-02-17 NOTE — Evaluation (Signed)
Physical Therapy Evaluation-1x Patient Details Name: Connie Larsen MRN: [id number] DOB: [birth year] Today's Date: 02/17/2022  History of Present Illness  56 yo female admitted with hepatic encephalopathy. Hx of liver cirrhosis, morbid obesity, depression, memory loss, anemia, autoimmune hepatitis, R ankle fx, PTSD, OA, COVID  Clinical Impression  On eval, pt was Mod Ind with mobility. She walked ~115 feet without a device. Pt tolerated distance well. No LOB. 1x eval. Will sign off.        Recommendations for follow up therapy are one component of a multi-disciplinary discharge planning process, led by the attending physician.  Recommendations may be updated based on patient status, additional functional criteria and insurance authorization.  Follow Up Recommendations No PT follow up      Assistance Recommended at Discharge None  Patient can return home with the following       Equipment Recommendations None recommended by PT  Recommendations for Other Services       Functional Status Assessment Patient has not had a recent decline in their functional status     Precautions / Restrictions Precautions Precautions: Fall Restrictions Weight Bearing Restrictions: No      Mobility  Bed Mobility Overal bed mobility: Modified Independent                  Transfers Overall transfer level: Modified independent                      Ambulation/Gait Ambulation/Gait assistance: Modified independent (Device/Increase time) Gait Distance (Feet): 115 Feet Assistive device: None Gait Pattern/deviations: Step-through pattern          Stairs            Wheelchair Mobility    Modified Rankin (Stroke Patients Only)       Balance Overall balance assessment: Mild deficits observed, not formally tested                                           Pertinent Vitals/Pain Pain Assessment Pain Assessment: No/denies pain    Home Living  Family/patient expects to be discharged to:: Private residence Living Arrangements: Alone Available Help at Discharge: Family Type of Home: House Home Access: Stairs to enter Entrance Stairs-Rails: Right Entrance Stairs-Number of Steps: 2   Home Layout: One level Home Equipment: Cane - single Environmental consultant (2 wheels)      Prior Function Prior Level of Function : Independent/Modified Independent             Mobility Comments: Pt reports using furniture around the house to stabilize       Hand Dominance        Extremity/Trunk Assessment   Upper Extremity Assessment Upper Extremity Assessment: Overall WFL for tasks assessed    Lower Extremity Assessment Lower Extremity Assessment: Overall WFL for tasks assessed    Cervical / Trunk Assessment Cervical / Trunk Assessment: Normal  Communication   Communication: No difficulties  Cognition Arousal/Alertness: Awake/alert Behavior During Therapy: WFL for tasks assessed/performed Overall Cognitive Status: Within Functional Limits for tasks assessed                                          General Comments      Exercises     Assessment/Plan  PT Assessment Patient does not need any further PT services  PT Problem List         PT Treatment Interventions      PT Goals (Current goals can be found in the Care Plan section)  Acute Rehab PT Goals Patient Stated Goal: home PT Goal Formulation: All assessment and education complete, DC therapy    Frequency       Co-evaluation               AM-PAC PT "6 Clicks" Mobility  Outcome Measure Help needed turning from your back to your side while in a flat bed without using bedrails?: None Help needed moving from lying on your back to sitting on the side of a flat bed without using bedrails?: None Help needed moving to and from a bed to a chair (including a wheelchair)?: None Help needed standing up from a chair using your arms  (e.g., wheelchair or bedside chair)?: None Help needed to walk in hospital room?: None Help needed climbing 3-5 steps with a railing? : None 6 Click Score: 24    End of Session   Activity Tolerance: Patient tolerated treatment well Patient left: in bed;with call bell/phone within reach        Time: 1445-1457 PT Time Calculation (min) (ACUTE ONLY): 12 min   Charges:   PT Evaluation $PT Eval Low Complexity: [place], PT Acute Rehabilitation  Office: [phone] Pager: [phone]

## 2022-02-17 NOTE — Assessment & Plan Note (Signed)
No variceal bleeding

## 2022-02-17 NOTE — Assessment & Plan Note (Signed)
Continue escitalopram 

## 2022-02-17 NOTE — Assessment & Plan Note (Signed)
Due to cirrhosis, no bleeidng.

## 2022-02-17 NOTE — Assessment & Plan Note (Signed)
Creatinine improved back to baseline with fluids

## 2022-02-18 DIAGNOSIS — I480 Paroxysmal atrial fibrillation: Secondary | ICD-10-CM

## 2022-02-18 LAB — COMPREHENSIVE METABOLIC PANEL
ALT: 30 U/L (ref 0–44)
AST: 52 U/L — ABNORMAL HIGH (ref 15–41)
Albumin: 2.2 g/dL — ABNORMAL LOW (ref 3.5–5.0)
Alkaline Phosphatase: 62 U/L (ref 38–126)
Anion gap: 3 — ABNORMAL LOW (ref 5–15)
BUN: 12 mg/dL (ref 6–20)
CO2: 22 mmol/L (ref 22–32)
Calcium: 8 mg/dL — ABNORMAL LOW (ref 8.9–10.3)
Chloride: 113 mmol/L — ABNORMAL HIGH (ref 98–111)
Creatinine, Ser: 1.01 mg/dL — ABNORMAL HIGH (ref 0.44–1.00)
GFR, Estimated: >60 mL/min (ref >60)
Glucose, Bld: 89 mg/dL (ref 70–99)
Potassium: 3.7 mmol/L (ref 3.5–5.1)
Sodium: 138 mmol/L (ref 135–145)
Total Bilirubin: 2.2 mg/dL — ABNORMAL HIGH (ref 0.3–1.2)
Total Protein: 5.6 g/dL — ABNORMAL LOW (ref 6.5–8.1)

## 2022-02-18 LAB — CBC
HCT: 32.5 % — ABNORMAL LOW (ref 36.0–46.0)
Hemoglobin: 11 g/dL — ABNORMAL LOW (ref 12.0–15.0)
MCH: 32.3 pg (ref 26.0–34.0)
MCHC: 33.8 g/dL (ref 30.0–36.0)
MCV: 95.3 fL (ref 80.0–100.0)
Platelets: 78 10*3/uL — ABNORMAL LOW (ref 150–400)
RBC: 3.41 MIL/uL — ABNORMAL LOW (ref 3.87–5.11)
RDW: 14 % (ref 11.5–15.5)
WBC: 3 10*3/uL — ABNORMAL LOW (ref 4.0–10.5)
nRBC: 0 % (ref 0.0–0.2)

## 2022-02-18 MED ORDER — IBUPROFEN 200 MG PO TABS
200.0000 mg | ORAL_TABLET | Freq: Once | ORAL | Status: AC
  Administered 2022-02-18: 200 mg via ORAL
  Filled 2022-02-18: qty 1

## 2022-02-18 NOTE — Discharge Summary (Signed)
Physician Discharge Summary   Patient: Connie Larsen MRN: [id number] DOB: Oct 11, 1965  Admit date:     02/16/2022  Discharge date: 02/18/22  Discharge Physician: Alberteen Sam   PCP: Ailene Ravel, MD     Recommendations at discharge:  Follow up with PCP Dr. Nathanial Rancher in 1 week Dr. Nathanial Rancher: TSH 94.4 uIU/mL here in hospital, LT4 restarted --> Please obtain TSH in 4 weeks      Discharge Diagnoses: Principal Problem:   Acute metabolic encephalopathy Active Problems:   Paroxysmal atrial fibrillation (HCC)   Cirrhosis of liver (HCC)   Hypothyroidism   Depression with anxiety   Portal hypertension (HCC)   Hypokalemia   Thrombocytopenia (HCC)   Morbid obesity (HCC)   Autoimmune hepatitis [facility])      Hospital Course: Connie Larsen is a 56 y.o. F with AIH and cirrhosis, pAF not on AC, depression, hypothyroidism,presented with hepatic encephalopathy.       * Acute metabolic encephalopathy Likely this was multifactorial from dehydration, acute hepatic encephalopathy and hypothyroidism.  AKI ruled out.  She was admitted and found to have elevated ammonia and severely elevated TSH.  Started on rifaximin, lactulose and IV levothyroxine and Cytomel.  Her mentation returned to baseline, she was ambulatory without assistance, felt asymptomatic and at baseline.  Discharged home with confirmed supply of rifaximin, lactulose and levothyroxine     Hypothyroidism TSH 94.4 uIU/mL on admission.  Had not been taking her LT4 for over a month due to financial reasons.     Treated with IV LT4 here and Cytomel, and her symptoms resolved so transitioned back to PO and recommend repeat TSH by PCP at appriopriate interval.  Confirmed she had a supply at home.     Paroxysmal atrial fibrillation (HCC) Patient with history of Afib, not on AC.  Currently rate controlled  Autoimmune hepatitis (HCC) On azathioprine.  No focal signs of infection here.  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   Cirrhosis of liver (HCC)              The [facility] Controlled Substances Registry was reviewed for this patient prior to discharge.   Disposition: Home   DISCHARGE MEDICATION: Allergies as of 02/18/2022       Reactions   Aspirin Hives   Propoxyphene Anaphylaxis   Tramadol Anaphylaxis   Morphine And Related Nausea And Vomiting   Tylenol [acetaminophen] Other (See Comments)   Was told not to take due to liver   Prednisone Rash        Medication List     TAKE these medications    azaTHIOprine 50 MG tablet Commonly known as: IMURAN Take 100 mg by mouth daily in the afternoon.   diphenhydrAMINE 25 mg capsule Commonly known as: BENADRYL Take 25 mg by mouth every 8 (eight) hours as needed for allergies or itching.   escitalopram 20 MG tablet Commonly known as: LEXAPRO Take 20 mg by mouth daily in the afternoon.   ferrous sulfate 325 (65 FE) MG tablet Take 325 mg by mouth daily in the afternoon.   IMMUNE FORMULA PO Take 2 tablets by mouth daily in the afternoon. Immune Plus   L-CARNITINE PO Take 1 tablet by mouth daily in the afternoon.   lactulose 10 GM/15ML solution Commonly known as: CHRONULAC Take 45 mLs (30 g total) by mouth 3 (three) times daily.   levothyroxine 100 MCG tablet Commonly known as: SYNTHROID Take 100 mcg by mouth  daily at 6 (six) AM.   Lubricant Eye Drops 0.4-0.3 % Soln Generic drug: Polyethyl Glycol-Propyl Glycol Place 1-2 drops into both eyes 3 (three) times daily as needed (dry/irritated eyes).   ondansetron 8 MG tablet Commonly known as: ZOFRAN Take 8 mg by mouth every 8 (eight) hours as needed for nausea or vomiting.   primidone 50 MG tablet Commonly known as: MYSOLINE Take 50 mg by mouth daily in the afternoon.   rifaximin 550 MG Tabs tablet Commonly known as: XIFAXAN Take 1 tablet (550 mg total) by mouth 2 (two) times daily.   traZODone 50 MG  tablet Commonly known as: DESYREL Take 50 mg by mouth at bedtime.   Zinc 100 MG Tabs Take 200 mg by mouth daily in the afternoon.        Follow-up Information     Hamrick, Durward Fortes, MD. Schedule an appointment as soon as possible for a visit.   Specialty: Family Medicine Contact information: [address] [place] [address] [phone]                   Discharge Exam: Filed Weights   02/17/22 0834  Weight: 131.8 kg    General: Pt is alert, awake, not in acute distress Cardiovascular: RRR, nl S1-S2, no murmurs appreciated.   No LE edema.   Respiratory: Normal respiratory rate and rhythm.  CTAB without rales or wheezes. Abdominal: Abdomen soft and non-tender.  No distension or HSM.   Neuro/Psych: Strength symmetric in upper and lower extremities.  Judgment and insight appear normal.   Condition at discharge: good  The results of significant diagnostics from this hospitalization (including imaging, microbiology, ancillary and laboratory) are listed below for reference.   Imaging Studies: US RENAL  Result Date: 02/16/2022 CLINICAL DATA:  Acute kidney injury. EXAM: RENAL / URINARY TRACT ULTRASOUND COMPLETE COMPARISON:  None Available. FINDINGS: Right Kidney: Renal measurements: 11.8 cm x 4.7 cm x 5.0 cm = volume: 145.5 mL. Echogenicity within normal limits. A 2.8 cm x 1.9 cm x 2.1 cm cyst is seen within the right kidney. No abnormal flow is seen within this region on color Doppler evaluation. No hydronephrosis is visualized. Left Kidney: Renal measurements: 10.1 cm x 4.6 cm x 5.9 cm = volume: 144.7 mL. Echogenicity within normal limits. No mass or hydronephrosis visualized. Bladder: Appears normal for degree of bladder distention. The bilateral ureteral jets are visualized. Other: Limited study secondary to the patient's body habitus and overlying bowel gas. IMPRESSION: Right renal cyst. Electronically Signed   By: Aram Candela M.D.   On: 02/16/2022 20:15    CT HEAD WO CONTRAST  Result Date: 02/16/2022 CLINICAL DATA:  A 56 year old female presents with mental status changes of unknown cause. EXAM: CT HEAD WITHOUT CONTRAST TECHNIQUE: Contiguous axial images were obtained from the base of the skull through the vertex without intravenous contrast. RADIATION DOSE REDUCTION: This exam was performed according to the departmental dose-optimization program which includes automated exposure control, adjustment of the mA and/or kV according to patient size and/or use of iterative reconstruction technique. COMPARISON:  December 31, 2018 and November of 2022 FINDINGS: Brain: No evidence of acute infarction, hemorrhage, hydrocephalus, extra-axial collection or mass lesion/mass effect. Vascular: No hyperdense vessel or unexpected calcification. Skull: Normal. Negative for fracture or focal lesion. Sinuses/Orbits: Patchy opacification of ethmoid sinuses with mild mucosal thickening of LEFT sphenoid sinus. Sinus is incompletely imaged as are the orbits and areas otherwise negative for acute findings. Small LEFT mastoid effusion unchanged from  previous imaging. Other: None. IMPRESSION: 1. No acute intracranial abnormality. 2. Mild sinus disease. Electronically Signed   By: Zetta Bills M.D.   On: 02/16/2022 08:56    Microbiology: Results for orders placed or performed during the hospital encounter of 12/30/21  SARS Coronavirus 2 by RT PCR (hospital order, performed in [facility] hospital lab) *cepheid single result test* Anterior Nasal Swab     Status: None   Collection Time: 12/31/21  5:20 AM   Specimen: Anterior Nasal Swab  Result Value Ref Range Status   SARS Coronavirus 2 by RT PCR NEGATIVE NEGATIVE Final    Comment: (NOTE) SARS-CoV-2 target nucleic acids are NOT DETECTED.  The SARS-CoV-2 RNA is generally detectable in upper and lower respiratory specimens during the acute phase of infection. The lowest concentration of SARS-CoV-2 viral copies this assay can  detect is 250 copies / mL. A negative result does not preclude SARS-CoV-2 infection and should not be used as the sole basis for treatment or other patient management decisions.  A negative result may occur with improper specimen collection / handling, submission of specimen other than nasopharyngeal swab, presence of viral mutation(s) within the areas targeted by this assay, and inadequate number of viral copies (<250 copies / mL). A negative result must be combined with clinical observations, patient history, and epidemiological information.  Fact Sheet for Patients:   https://www.patel.info/  Fact Sheet for Healthcare Providers: https://hall.com/  This test is not yet approved or  cleared by the [place] FDA and has been authorized for detection and/or diagnosis of SARS-CoV-2 by FDA under an Emergency Use Authorization (EUA).  This EUA will remain in effect (meaning this test can be used) for the duration of the COVID-19 declaration under Section 564(b)(1) of the Act, 21 U.S.C. section 360bbb-3(b)(1), unless the authorization is terminated or revoked sooner.  Performed at [facility], [place] [address], [place], [place] [address]     Labs: CBC: Recent Labs  Lab 02/16/22 [phone] 02/17/22 0432 02/18/22 0518  WBC 3.4* 3.2* 3.0*  NEUTROABS 2.2  --   --   HGB 13.1 11.1* 11.0*  HCT 39.7 32.9* 32.5*  MCV 94.3 95.9 95.3  PLT 95* 82* 78*   Basic Metabolic Panel: Recent Labs  Lab 02/16/22 0835 02/17/22 0432 02/18/22 0518  NA 141 141 138  K 3.3* 3.6 3.7  CL 111 115* 113*  CO2 24 21* 22  GLUCOSE 99 75 89  BUN 11 13 12   CREATININE 1.10* 1.08* 1.01*  CALCIUM 8.6* 8.1* 8.0*  MG 1.9  --   --    Liver Function Tests: Recent Labs  Lab 02/16/22 0835 02/17/22 0432 02/18/22 0518  AST 62* 51* 52*  ALT 37 29 30  ALKPHOS 69 56 62  BILITOT 2.1* 2.1* 2.2*  PROT 7.0 5.5* 5.6*  ALBUMIN 2.8* 2.2* 2.2*    CBG: Recent Labs  Lab 02/16/22 [place]    Discharge time spent: approximately 35 minutes spent on discharge counseling, evaluation of patient on day of discharge, and coordination of discharge planning with nursing, social work, pharmacy and case management  Signed: Edwin Dada, MD [facility] 02/18/2022

## 2022-02-18 NOTE — TOC Initial Note (Signed)
Transition of Care [facility]) - Initial/Assessment Note    Patient Details  Name: Connie Larsen MRN: [id number] Date of Birth: 1965/07/19  Transition of Care [facility]) CM/SW Contact:    Leeroy Cha, RN Phone Number: 02/18/2022, 7:54 AM  Clinical Narrative:                  Transition of Care [facility]) Screening Note   Patient Details  Name: Connie Larsen Date of Birth: 06/02/1965   Transition of Care [facility]) CM/SW Contact:    Leeroy Cha, RN Phone Number: 02/18/2022, 7:54 AM    Transition of Care Department [facility]) has reviewed patient and no TOC needs have been identified at this time. We will continue to monitor patient advancement through interdisciplinary progression rounds. If new patient transition needs arise, please place a TOC consult.    Expected Discharge Plan: Home/Self Care Barriers to Discharge: Continued Medical Work up   Patient Goals and CMS Choice Patient states their goals for this hospitalization and ongoing recovery are:: to go home      Expected Discharge Plan and Services Expected Discharge Plan: Home/Self Care   Discharge Planning Services: CM Consult   Living arrangements for the past 2 months: Single Family Home                                      Prior Living Arrangements/Services Living arrangements for the past 2 months: Single Family Home Lives with:: Self Patient language and need for interpreter reviewed:: Yes Do you feel safe going back to the place where you live?: Yes            Criminal Activity/Legal Involvement Pertinent to Current Situation/Hospitalization: No - Comment as needed  Activities of Daily Living Home Assistive Devices/Equipment: Eyeglasses, Environmental consultant (specify type), Cane (specify quad or straight) ADL Screening (condition at time of admission) Patient's cognitive ability adequate to safely complete daily activities?: No Is the patient deaf or have difficulty hearing?: No Does the patient have  difficulty seeing, even when wearing glasses/contacts?: No Does the patient have difficulty concentrating, remembering, or making decisions?: Yes Patient able to express need for assistance with ADLs?: Yes Does the patient have difficulty dressing or bathing?: Yes Independently performs ADLs?: No Communication: Independent Dressing (OT): Needs assistance Is this a change from baseline?: Change from baseline, expected to last <3days Grooming: Independent Feeding: Independent Bathing: Needs assistance Is this a change from baseline?: Change from baseline, expected to last <3 days Toileting: Needs assistance Is this a change from baseline?: Change from baseline, expected to last <3 days In/Out Bed: Needs assistance Is this a change from baseline?: Change from baseline, expected to last <3 days Walks in Home: Needs assistance Is this a change from baseline?: Change from baseline, expected to last <3 days Does the patient have difficulty walking or climbing stairs?: Yes Weakness of Legs: Both Weakness of Arms/Hands: Both  Permission Sought/Granted                  Emotional Assessment Appearance:: Appears stated age Attitude/Demeanor/Rapport: Engaged Affect (typically observed): Calm Orientation: : Oriented to Self, Oriented to Place, Oriented to  Time, Oriented to Situation Alcohol / Substance Use: Never Used Psych Involvement: No (comment)  Admission diagnosis:  Hepatic encephalopathy ([place]) [K76.82] Encephalopathy, hepatic ([place]) [M84.13] Acute metabolic encephalopathy [K44.01] Patient Active Problem List   Diagnosis Date Noted   Autoimmune hepatitis ([place]) 02/17/2022  Hypokalemia 02/16/2022   Thrombocytopenia ([place]) 02/16/2022   Morbid obesity ([place]) 0000000   Acute metabolic encephalopathy [id number]   Paroxysmal atrial fibrillation ([place]) 11/04/2021   Cirrhosis of liver ([place]) 04/11/2021   Hypothyroidism 04/11/2021   Depression with anxiety 09/22/2019   Portal  hypertension ([place]) 08/20/2017   PCP:  Leonides Sake, MD Pharmacy:   [facility] [address] ([address] [place] [id number] [place] [address] Phone: [phone] Fax: [phone]     Social Determinants of Health (SDOH) Interventions    Readmission Risk Interventions   No data to display

## 2022-02-18 NOTE — TOC Transition Note (Signed)
Transition of Care [facility]) - CM/SW Discharge Note   Patient Details  Name: Connie Larsen MRN: [id number] Date of Birth: [birth year]  Transition of Care [name]) CM/SW Contact:  Leeroy Cha, RN Phone Number: 02/18/2022, 9:57 AM   Clinical Narrative:     Dcd to home with no toc needs  Final next level of care: Home/Self Care Barriers to Discharge: Barriers Resolved   Patient Goals and CMS Choice Patient states their goals for this hospitalization and ongoing recovery are:: to go home      Discharge Placement                       Discharge Plan and Services   Discharge Planning Services: CM Consult                                 Social Determinants of Health (SDOH) Interventions     Readmission Risk Interventions   No data to display

## 2022-02-18 NOTE — Progress Notes (Signed)
Patient was given discharge instructions, and all questions were answered.  Patient was stable for discharge and was taken to the main exit by wheelchair. 

## 2022-02-19 LAB — T3: T3, Total: 57 ng/dL — ABNORMAL LOW (ref 71–180)

## 2022-02-23 ENCOUNTER — Telehealth: Payer: Self-pay | Admitting: Physical Therapy

## 2022-02-23 NOTE — Telephone Encounter (Signed)
PT spoke to pt daughter about canceling evaluation scheduled for 02/24/2022 due to recent formal discharge from the hospital since referral was issued.  Edu to daughter to obtain a new referral for PT if still needed.  Elease Etienne, PT, DPT

## 2022-02-24 ENCOUNTER — Encounter: Payer: Self-pay | Admitting: Nurse Practitioner

## 2022-02-24 ENCOUNTER — Emergency Department (HOSPITAL_COMMUNITY): Payer: Medicare Other

## 2022-02-24 ENCOUNTER — Encounter (HOSPITAL_COMMUNITY): Payer: Self-pay

## 2022-02-24 ENCOUNTER — Ambulatory Visit: Payer: Medicare Other | Admitting: Physical Therapy

## 2022-02-24 ENCOUNTER — Other Ambulatory Visit: Payer: Self-pay

## 2022-02-24 ENCOUNTER — Inpatient Hospital Stay (HOSPITAL_COMMUNITY)
Admission: EM | Admit: 2022-02-24 | Discharge: 2022-02-27 | DRG: 689 | Disposition: A | Discharge status: Home / Self Care | Payer: Medicare Other | Attending: Internal Medicine | Admitting: Internal Medicine

## 2022-02-24 DIAGNOSIS — G9341 Metabolic encephalopathy: Secondary | ICD-10-CM | POA: Diagnosis not present

## 2022-02-24 DIAGNOSIS — K7290 Hepatic failure, unspecified without coma: Secondary | ICD-10-CM | POA: Diagnosis not present

## 2022-02-24 DIAGNOSIS — G4730 Sleep apnea, unspecified: Secondary | ICD-10-CM | POA: Diagnosis present

## 2022-02-24 DIAGNOSIS — Z9049 Acquired absence of other specified parts of digestive tract: Secondary | ICD-10-CM

## 2022-02-24 DIAGNOSIS — Z8249 Family history of ischemic heart disease and other diseases of the circulatory system: Secondary | ICD-10-CM

## 2022-02-24 DIAGNOSIS — N390 Urinary tract infection, site not specified: Secondary | ICD-10-CM | POA: Diagnosis not present

## 2022-02-24 DIAGNOSIS — R410 Disorientation, unspecified: Secondary | ICD-10-CM | POA: Diagnosis not present

## 2022-02-24 DIAGNOSIS — Z888 Allergy status to other drugs, medicaments and biological substances status: Secondary | ICD-10-CM

## 2022-02-24 DIAGNOSIS — D696 Thrombocytopenia, unspecified: Secondary | ICD-10-CM | POA: Diagnosis not present

## 2022-02-24 DIAGNOSIS — Z90710 Acquired absence of both cervix and uterus: Secondary | ICD-10-CM

## 2022-02-24 DIAGNOSIS — K766 Portal hypertension: Secondary | ICD-10-CM | POA: Diagnosis present

## 2022-02-24 DIAGNOSIS — K754 Autoimmune hepatitis: Secondary | ICD-10-CM | POA: Diagnosis not present

## 2022-02-24 DIAGNOSIS — K7682 Hepatic encephalopathy: Secondary | ICD-10-CM | POA: Diagnosis present

## 2022-02-24 DIAGNOSIS — K7469 Other cirrhosis of liver: Secondary | ICD-10-CM

## 2022-02-24 DIAGNOSIS — R109 Unspecified abdominal pain: Secondary | ICD-10-CM | POA: Diagnosis not present

## 2022-02-24 DIAGNOSIS — I480 Paroxysmal atrial fibrillation: Secondary | ICD-10-CM | POA: Diagnosis present

## 2022-02-24 DIAGNOSIS — E876 Hypokalemia: Secondary | ICD-10-CM | POA: Diagnosis not present

## 2022-02-24 DIAGNOSIS — Z886 Allergy status to analgesic agent status: Secondary | ICD-10-CM | POA: Diagnosis not present

## 2022-02-24 DIAGNOSIS — Z885 Allergy status to narcotic agent status: Secondary | ICD-10-CM | POA: Diagnosis not present

## 2022-02-24 DIAGNOSIS — K7460 Unspecified cirrhosis of liver: Secondary | ICD-10-CM | POA: Diagnosis not present

## 2022-02-24 DIAGNOSIS — Z1152 Encounter for screening for COVID-19: Secondary | ICD-10-CM

## 2022-02-24 DIAGNOSIS — Z6841 Body Mass Index (BMI) 40.0 and over, adult: Secondary | ICD-10-CM | POA: Diagnosis not present

## 2022-02-24 DIAGNOSIS — R4781 Slurred speech: Secondary | ICD-10-CM | POA: Diagnosis not present

## 2022-02-24 DIAGNOSIS — Z79890 Hormone replacement therapy (postmenopausal): Secondary | ICD-10-CM

## 2022-02-24 DIAGNOSIS — F32A Depression, unspecified: Secondary | ICD-10-CM | POA: Diagnosis present

## 2022-02-24 DIAGNOSIS — I700 Atherosclerosis of aorta: Secondary | ICD-10-CM | POA: Diagnosis not present

## 2022-02-24 DIAGNOSIS — N3000 Acute cystitis without hematuria: Secondary | ICD-10-CM | POA: Diagnosis not present

## 2022-02-24 DIAGNOSIS — E039 Hypothyroidism, unspecified: Secondary | ICD-10-CM | POA: Diagnosis present

## 2022-02-24 DIAGNOSIS — Z79899 Other long term (current) drug therapy: Secondary | ICD-10-CM

## 2022-02-24 DIAGNOSIS — F4310 Post-traumatic stress disorder, unspecified: Secondary | ICD-10-CM | POA: Diagnosis present

## 2022-02-24 DIAGNOSIS — D6959 Other secondary thrombocytopenia: Secondary | ICD-10-CM | POA: Diagnosis present

## 2022-02-24 DIAGNOSIS — E860 Dehydration: Secondary | ICD-10-CM | POA: Diagnosis present

## 2022-02-24 LAB — URINALYSIS, ROUTINE W REFLEX MICROSCOPIC
Bilirubin Urine: NEGATIVE
Glucose, UA: NEGATIVE mg/dL
Ketones, ur: NEGATIVE mg/dL
Nitrite: NEGATIVE
Protein, ur: NEGATIVE mg/dL
Specific Gravity, Urine: 1.017 (ref 1.005–1.030)
pH: 5 (ref 5.0–8.0)

## 2022-02-24 LAB — CBC WITH DIFFERENTIAL/PLATELET
Abs Immature Granulocytes: 0.01 10*3/uL (ref 0.00–0.07)
Basophils Absolute: 0 10*3/uL (ref 0.0–0.1)
Basophils Relative: 1 %
Eosinophils Absolute: 0.2 10*3/uL (ref 0.0–0.5)
Eosinophils Relative: 5 %
HCT: 34.3 % — ABNORMAL LOW (ref 36.0–46.0)
Hemoglobin: 11.8 g/dL — ABNORMAL LOW (ref 12.0–15.0)
Immature Granulocytes: 0 %
Lymphocytes Relative: 22 %
Lymphs Abs: 0.8 10*3/uL (ref 0.7–4.0)
MCH: 33.1 pg (ref 26.0–34.0)
MCHC: 34.4 g/dL (ref 30.0–36.0)
MCV: 96.1 fL (ref 80.0–100.0)
Monocytes Absolute: 0.3 10*3/uL (ref 0.1–1.0)
Monocytes Relative: 9 %
Neutro Abs: 2.4 10*3/uL (ref 1.7–7.7)
Neutrophils Relative %: 63 %
Platelets: 95 10*3/uL — ABNORMAL LOW (ref 150–400)
RBC: 3.57 MIL/uL — ABNORMAL LOW (ref 3.87–5.11)
RDW: 14.9 % (ref 11.5–15.5)
WBC: 3.8 10*3/uL — ABNORMAL LOW (ref 4.0–10.5)
nRBC: 0 % (ref 0.0–0.2)

## 2022-02-24 LAB — MAGNESIUM: Magnesium: 2.3 mg/dL (ref 1.7–2.4)

## 2022-02-24 LAB — COMPREHENSIVE METABOLIC PANEL
ALT: 34 U/L (ref 0–44)
AST: 57 U/L — ABNORMAL HIGH (ref 15–41)
Albumin: 2.6 g/dL — ABNORMAL LOW (ref 3.5–5.0)
Alkaline Phosphatase: 79 U/L (ref 38–126)
Anion gap: 4 — ABNORMAL LOW (ref 5–15)
BUN: 9 mg/dL (ref 6–20)
CO2: 22 mmol/L (ref 22–32)
Calcium: 8 mg/dL — ABNORMAL LOW (ref 8.9–10.3)
Chloride: 113 mmol/L — ABNORMAL HIGH (ref 98–111)
Creatinine, Ser: 0.92 mg/dL (ref 0.44–1.00)
GFR, Estimated: >60 mL/min (ref >60)
Glucose, Bld: 94 mg/dL (ref 70–99)
Potassium: 3.2 mmol/L — ABNORMAL LOW (ref 3.5–5.1)
Sodium: 139 mmol/L (ref 135–145)
Total Bilirubin: 1.8 mg/dL — ABNORMAL HIGH (ref 0.3–1.2)
Total Protein: 6.4 g/dL — ABNORMAL LOW (ref 6.5–8.1)

## 2022-02-24 LAB — RAPID URINE DRUG SCREEN, HOSP PERFORMED
Amphetamines: NOT DETECTED
Barbiturates: NOT DETECTED
Benzodiazepines: NOT DETECTED
Cocaine: NOT DETECTED
Opiates: NOT DETECTED
Tetrahydrocannabinol: NOT DETECTED

## 2022-02-24 LAB — CBG MONITORING, ED: Glucose-Capillary: 91 mg/dL (ref 70–99)

## 2022-02-24 LAB — AMMONIA: Ammonia: 61 umol/L — ABNORMAL HIGH (ref 9–35)

## 2022-02-24 LAB — RESP PANEL BY RT-PCR (FLU A&B, COVID) ARPGX2
Influenza A by PCR: NEGATIVE
Influenza B by PCR: NEGATIVE
SARS Coronavirus 2 by RT PCR: NEGATIVE

## 2022-02-24 LAB — TSH: TSH: 64.862 u[IU]/mL — ABNORMAL HIGH (ref 0.350–4.500)

## 2022-02-24 MED ORDER — ZINC 100 MG PO TABS: 200.0000 mg | ORAL_TABLET | Freq: Every day | ORAL | Status: DC

## 2022-02-24 MED ORDER — SODIUM CHLORIDE 0.9 % IV SOLN: INTRAVENOUS | Status: AC

## 2022-02-24 MED ORDER — FERROUS SULFATE 325 (65 FE) MG PO TABS
325.0000 mg | ORAL_TABLET | Freq: Every day | ORAL | Status: DC
  Administered 2022-02-25 – 2022-02-26 (×2): 325 mg via ORAL
  Filled 2022-02-24 (×2): qty 1

## 2022-02-24 MED ORDER — ESCITALOPRAM OXALATE 20 MG PO TABS
20.0000 mg | ORAL_TABLET | Freq: Every day | ORAL | Status: DC
  Administered 2022-02-25 – 2022-02-26 (×2): 20 mg via ORAL
  Filled 2022-02-24 (×2): qty 1

## 2022-02-24 MED ORDER — LEVOTHYROXINE SODIUM 100 MCG PO TABS
100.0000 ug | ORAL_TABLET | Freq: Every day | ORAL | Status: DC
  Administered 2022-02-25 – 2022-02-27 (×3): 100 ug via ORAL
  Filled 2022-02-24 (×3): qty 1

## 2022-02-24 MED ORDER — ONDANSETRON 4 MG PO TBDP
4.0000 mg | ORAL_TABLET | Freq: Once | ORAL | Status: AC
  Administered 2022-02-24: 4 mg via ORAL
  Filled 2022-02-24: qty 1

## 2022-02-24 MED ORDER — ZINC SULFATE 220 (50 ZN) MG PO CAPS
220.0000 mg | ORAL_CAPSULE | Freq: Every day | ORAL | Status: DC
  Administered 2022-02-25 – 2022-02-26 (×2): 220 mg via ORAL
  Filled 2022-02-24 (×2): qty 1

## 2022-02-24 MED ORDER — RIFAXIMIN 550 MG PO TABS
550.0000 mg | ORAL_TABLET | Freq: Two times a day (BID) | ORAL | Status: DC
  Administered 2022-02-25 – 2022-02-27 (×6): 550 mg via ORAL
  Filled 2022-02-24 (×6): qty 1

## 2022-02-24 MED ORDER — SODIUM CHLORIDE 0.9 % IV SOLN: 1.0000 g | INTRAVENOUS | Status: DC

## 2022-02-24 MED ORDER — POTASSIUM CHLORIDE CRYS ER 20 MEQ PO TBCR
40.0000 meq | EXTENDED_RELEASE_TABLET | Freq: Once | ORAL | Status: AC
  Administered 2022-02-24: 40 meq via ORAL
  Filled 2022-02-24: qty 2

## 2022-02-24 MED ORDER — CALCIUM CARBONATE 1250 (500 CA) MG PO TABS
1.0000 | ORAL_TABLET | Freq: Once | ORAL | Status: AC
  Administered 2022-02-24: 1250 mg via ORAL
  Filled 2022-02-24: qty 1

## 2022-02-24 MED ORDER — SODIUM CHLORIDE 0.9 % IV SOLN
2.0000 g | Freq: Once | INTRAVENOUS | Status: AC
  Administered 2022-02-24: 2 g via INTRAVENOUS
  Filled 2022-02-24: qty 20

## 2022-02-24 MED ORDER — AZATHIOPRINE 50 MG PO TABS
100.0000 mg | ORAL_TABLET | Freq: Every day | ORAL | Status: DC
  Administered 2022-02-25 – 2022-02-26 (×2): 100 mg via ORAL
  Filled 2022-02-24 (×3): qty 2

## 2022-02-24 MED ORDER — LACTULOSE 10 GM/15ML PO SOLN
30.0000 g | Freq: Three times a day (TID) | ORAL | Status: DC
  Administered 2022-02-24 – 2022-02-27 (×8): 30 g via ORAL
  Filled 2022-02-24 (×4): qty 45
  Filled 2022-02-24: qty 60
  Filled 2022-02-24 (×3): qty 45

## 2022-02-24 MED ORDER — PRIMIDONE 50 MG PO TABS
50.0000 mg | ORAL_TABLET | Freq: Every day | ORAL | Status: DC
  Administered 2022-02-25 – 2022-02-26 (×2): 50 mg via ORAL
  Filled 2022-02-24 (×3): qty 1

## 2022-02-24 MED ORDER — IOHEXOL 300 MG/ML  SOLN
100.0000 mL | Freq: Once | INTRAMUSCULAR | Status: AC | PRN
  Administered 2022-02-24: 100 mL via INTRAVENOUS

## 2022-02-24 NOTE — ED Provider Notes (Signed)
[facility] DEPT Provider Note   CSN: [id number] Arrival date & time: 02/24/22  1712     History  Chief Complaint  Patient presents with   Emesis   Altered Mental Status    Connie Larsen is a 56 y.o. female with history of liver cirrhosis, hypothyroidism, depression, portal hypertension, paroxysmal atrial fibrillation, and autoimmune hepatitis who presents the emergency department for vomiting and altered mental status starting yesterday evening.  Son at bedside assisting with history.  Son states that he got a phone call around 430 this afternoon by his sister seeing that his mother was slurring her words and had been vomiting. He states that the patient recently got out of the hospital after being admitted for similar.  Normally when she starts developing the symptoms they know to bring her to the hospital and her ammonia level will be elevated.  He does not live with his mother, but states that his sister lives nearby and she contacted him this morning to tell him she needed to go to the hospital.  Patient unable to tell me how many times she has vomited today, but son says that she has not vomited while with him.  She is able to tell me her name, but unable to provide any further history.  Level 5 caveat due to altered mental status.  The history is provided by a relative. The history is limited by the condition of the patient.  Emesis Associated symptoms: abdominal pain   Altered Mental Status Associated symptoms: abdominal pain and vomiting        Home Medications Prior to Admission medications   Medication Sig Start Date End Date Taking? Authorizing Provider  azaTHIOprine (IMURAN) 50 MG tablet Take 100 mg by mouth daily in the afternoon. 04/18/21   [provider]  diphenhydrAMINE (BENADRYL) 25 mg capsule Take 25 mg by mouth every 8 (eight) hours as needed for allergies or itching.    [provider]  escitalopram (LEXAPRO) 20  MG tablet Take 20 mg by mouth daily in the afternoon. 10/19/20   [provider]  ferrous sulfate 325 (65 FE) MG tablet Take 325 mg by mouth daily in the afternoon. 07/04/20   [provider]  lactulose (CHRONULAC) 10 GM/15ML solution Take 45 mLs (30 g total) by mouth 3 (three) times daily. 11/06/21   Amin, Jeanella Flattery, MD  levOCARNitine (L-CARNITINE PO) Take 1 tablet by mouth daily in the afternoon.    [provider]  levothyroxine (SYNTHROID) 100 MCG tablet Take 100 mcg by mouth daily at 6 (six) AM. 05/31/21   [provider]  Misc Natural Products (IMMUNE FORMULA PO) Take 2 tablets by mouth daily in the afternoon. Immune Plus    [provider]  ondansetron (ZOFRAN) 8 MG tablet Take 8 mg by mouth every 8 (eight) hours as needed for nausea or vomiting.    [provider]  Polyethyl Glycol-Propyl Glycol (LUBRICANT EYE DROPS) 0.4-0.3 % SOLN Place 1-2 drops into both eyes 3 (three) times daily as needed (dry/irritated eyes).    [provider]  primidone (MYSOLINE) 50 MG tablet Take 50 mg by mouth daily in the afternoon. 04/19/21   [provider]  rifaximin (XIFAXAN) 550 MG TABS tablet Take 1 tablet (550 mg total) by mouth 2 (two) times daily. 04/15/21   Shelly Coss, MD  traZODone (DESYREL) 50 MG tablet Take 50 mg by mouth at bedtime.    [provider]  Zinc 100 MG  TABS Take 200 mg by mouth daily in the afternoon.    [provider]      Allergies    Aspirin, Propoxyphene, Tramadol, Morphine and related, Tylenol [acetaminophen], and Prednisone    Review of Systems   Review of Systems  Unable to perform ROS: Mental status change  Gastrointestinal:  Positive for abdominal pain and vomiting.    Physical Exam Updated Vital Signs BP 137/69   Pulse 79   Temp 98 F (36.7 C) (Oral)   Resp 11   SpO2 96%  Physical Exam Vitals and nursing note reviewed.  Constitutional:      Appearance: Normal  appearance.  HENT:     Head: Normocephalic and atraumatic.  Eyes:     Conjunctiva/sclera: Conjunctivae normal.  Cardiovascular:     Rate and Rhythm: Normal rate and regular rhythm.  Pulmonary:     Effort: Pulmonary effort is normal. No respiratory distress.     Breath sounds: Normal breath sounds.  Abdominal:     General: There is no distension.     Palpations: Abdomen is soft. There is no fluid wave.     Tenderness: There is abdominal tenderness in the left lower quadrant. There is guarding and rebound.  Skin:    General: Skin is warm and dry.  Neurological:     General: No focal deficit present.     Mental Status: She is disoriented.     Comments: Oriented to person. Can answer yes or no questions. Asterixis on exam. No focal deficits on exam.     ED Results / Procedures / Treatments   Labs (all labs ordered are listed, but only abnormal results are displayed) Labs Reviewed  COMPREHENSIVE METABOLIC PANEL - Abnormal; Notable for the following components:      Result Value   Potassium 3.2 (*)    Chloride 113 (*)    Calcium 8.0 (*)    Total Protein 6.4 (*)    Albumin 2.6 (*)    AST 57 (*)    Total Bilirubin 1.8 (*)    Anion gap 4 (*)    All other components within normal limits  CBC WITH DIFFERENTIAL/PLATELET - Abnormal; Notable for the following components:   WBC 3.8 (*)    RBC 3.57 (*)    Hemoglobin 11.8 (*)    HCT 34.3 (*)    Platelets 95 (*)    All other components within normal limits  URINALYSIS, ROUTINE W REFLEX MICROSCOPIC - Abnormal; Notable for the following components:   Color, Urine AMBER (*)    APPearance HAZY (*)    Hgb urine dipstick SMALL (*)    Leukocytes,Ua MODERATE (*)    Bacteria, UA MANY (*)    All other components within normal limits  AMMONIA - Abnormal; Notable for the following components:   Ammonia 61 (*)    All other components within normal limits  TSH - Abnormal; Notable for the following components:   TSH 64.862 (*)    All other  components within normal limits  RESP PANEL BY RT-PCR (FLU A&B, COVID) ARPGX2  RAPID URINE DRUG SCREEN, HOSP PERFORMED  T4, FREE  CBG MONITORING, ED    EKG None  Radiology CT HEAD WO CONTRAST  Result Date: 02/24/2022 CLINICAL DATA:  Confusion and vomiting EXAM: CT HEAD WITHOUT CONTRAST TECHNIQUE: Contiguous axial images were obtained from the base of the skull through the vertex without intravenous contrast. RADIATION DOSE REDUCTION: This exam was performed according to the departmental dose-optimization program which includes  automated exposure control, adjustment of the mA and/or kV according to patient size and/or use of iterative reconstruction technique. COMPARISON:  None Available.  02/16/2022 FINDINGS: Brain: No evidence of acute infarction, hemorrhage, mass, mass effect, or midline shift. No hydrocephalus or extra-axial fluid collection. Vascular: No hyperdense vessel. Skull: Normal. Negative for fracture or focal lesion. Sinuses/Orbits: No acute finding. Other: Trace fluid in left mastoid air cells. IMPRESSION: No acute intracranial process. Electronically Signed   By: Merilyn Baba M.D.   On: 02/24/2022 18:52    Procedures Procedures    Medications Ordered in ED Medications  iohexol (OMNIPAQUE) 300 MG/ML solution 100 mL (has no administration in time range)  cefTRIAXone (ROCEPHIN) 2 g in sodium chloride 0.9 % 100 mL IVPB (has no administration in time range)  ondansetron (ZOFRAN-ODT) disintegrating tablet 4 mg (4 mg Oral Given 02/24/22 1835)    ED Course/ Medical Decision Making/ A&P                           Medical Decision Making Amount and/or Complexity of Data Reviewed Labs: ordered. Radiology: ordered.  This patient is a 56 y.o. female  who presents to the ED for concern of altered mental status and vomiting. Hx of metabolic encephalopathy.   Differential diagnoses prior to evaluation: The emergent differential diagnosis includes, but is not limited to,  Drug  overdose or withdrawal, hypoxia, hyper/hypoglycemia, encephalopathy, sepsis, DKA/HHS, brain lesion, CVA, seizure, hypothermia, heat stroke, psychiatric. This is not an exhaustive differential.   Past Medical History / Co-morbidities: liver cirrhosis, hypothyroidism, depression, portal hypertension, paroxysmal atrial fibrillation, and autoimmune hepatitis  Additional history: Chart reviewed. Pertinent results include: Patient discharged on 10/4 after acute metabolic encephalopathy and hypothyroidism.  Physical Exam: Physical exam performed. The pertinent findings include: Normal vital signs. Oriented to person. Asterixis on exam. No focal deficits.   Lab Tests/Imaging studies: I personally interpreted labs/imaging and the pertinent results include: No leukocytosis, potassium 3.2, ammonia 61, urinalysis with moderate leukocytes but only 6-10 WBCs.  TSH 64.8, T4 pending.  Urine drug screen negative.  Respiratory panel negative for COVID and flu.  CT head without acute intracranial abnormalities.  I agree with the radiologist interpretation. CT abdomen pelvis pending.   Medications: I ordered medication including zofran and empiric antibiotics.  I have reviewed the patients home medicines and have made adjustments as needed.  Consultations obtained: Consulted with hospitalist Dr Flossie Buffy who will admit patient   Disposition: After consideration of the diagnostic results and the patients response to treatment, I feel that patient would benefit from admission for hepatic encephalopathy. Although there is no significant abdominal tenderness or fluid collection, started antibiotics for possible SBP.   Final Clinical Impression(s) / ED Diagnoses Final diagnoses:  Hepatic encephalopathy ([place])    Rx / DC Orders ED Discharge Orders     None      Portions of this report may have been transcribed using voice recognition software. Every effort was made to ensure accuracy; however, inadvertent  computerized transcription errors may be present.    Estill Cotta 02/24/22 2209    Regan Lemming, MD 02/25/22 [phone]

## 2022-02-24 NOTE — Assessment & Plan Note (Addendum)
-  Repleted. -Potassium at 3.7. 

## 2022-02-24 NOTE — Assessment & Plan Note (Signed)
No signs of bleeding 

## 2022-02-24 NOTE — ED Triage Notes (Signed)
Pt arrived via POV c/o confusion and vomiting. Pt abler to follow commands and answer some questions, but slow to answer. Hx of high ammonia levels.

## 2022-02-24 NOTE — ED Provider Triage Note (Signed)
Emergency Medicine Provider Triage Evaluation Note  Connie Larsen , a 56 y.o. female  was evaluated in triage.  Pt complains of altered mental status.  Patient is accompanied by son who is primary historian.  Son states he got a phone call around 4-4 30 this afternoon by his sister stating that his mom was slurring her words.  Patient is understanding and responds appropriately when given time with noticeable slur.  Son states this happened previously with elevated ammonia levels.  Patient states she is also concerned because she has been unable to get her thyroid medication since discharge.  Review of Systems  Positive: See abov Negative:   Physical Exam  BP (!) 140/70   Pulse 83   Temp 98.7 F (37.1 C)   Resp 18   SpO2 96%  Gen:   Awake, no distress   Resp:  Normal effort  MSK:   Moves extremities without difficulty  Other:    Medical Decision Making  Medically screening exam initiated at 5:42 PM.  Appropriate orders placed.  Connie Larsen was informed that the remainder of the evaluation will be completed by another provider, this initial triage assessment does not replace that evaluation, and the importance of remaining in the ED until their evaluation is complete.     Connie Larsen, [place] 02/24/22 1744

## 2022-02-24 NOTE — Assessment & Plan Note (Addendum)
Multifactorial from dehydration and UTI -ammonia level have improved from prior and reported adaquate bowel movements with lactulose.  TSH also improving.  No ascites on exam. However UA is more concerning for UTI.   - Urine culture with multiple species. -Patient placed empirically on IV Rocephin with clinical improvement and will be transitioned to oral cefdinir for 2 more days to complete a 5-day course of antibiotic treatment.   -Patient will be discharged in stable and improved condition with outpatient follow-up with PCP.

## 2022-02-24 NOTE — Assessment & Plan Note (Signed)
BMI >40 Lifestyle modification -Outpatient follow-up with PCP.

## 2022-02-24 NOTE — Assessment & Plan Note (Addendum)
-  secondary to autoimmune hepatitis Follows at [facility]. Not a transplant candidate due to MELD score and morbid obesity. -Patient was maintained on home regimen rifaximin, lactulose and azathioprine  -Outpatient follow-up.

## 2022-02-24 NOTE — Assessment & Plan Note (Signed)
Not on anticoagulation 

## 2022-02-24 NOTE — Progress Notes (Signed)
Patient is transferred to [facility] at 2325. Alert and oriented x 4. No pain is complained. Vital signs was taken and call light is within patient's reach.

## 2022-02-24 NOTE — Assessment & Plan Note (Addendum)
Platelet remained stable during the hospitalization and was 71 by day of discharge and close to baseline.   -Patient with no overt bleeding during the hospitalization.  -Thrombocytopenia secondary to cirrhosis.   -Outpatient follow-up with PCP.

## 2022-02-24 NOTE — Assessment & Plan Note (Addendum)
-  previous admitted with TSH of 43 and this has improved down to 64.  -Per admitting physician patient has not yet started levothyroxine  since did not receive from pharmacy until day of admission. -Patient was maintained on levothyroxine during the hospitalization.  -Will need TFTs in 4 to 6 weeks. -Outpatient follow-up with PCP.

## 2022-02-24 NOTE — H&P (Signed)
History and Physical    Patient: Connie Larsen [id number] DOB: May 05, 1966 DOA: 02/24/2022 DOS: the patient was seen and examined on 02/24/2022 PCP: Leonides Sake, MD  Patient coming from: Home  Chief Complaint:  Chief Complaint  Patient presents with   Emesis   Altered Mental Status   HPI: Connie Larsen is a 56 y.o. 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  Son at bedside provides some hx. Patient lives alone but has plans to move in with daughter soon due to declining health. Pt reports nausea and vomiting this morning. Then daughter came to visit later in the day and found her confused with slurred speech. She denies abdominal pain. Never had ascites with her cirrhosis. Has been taking her lactulose with 3-4 bowel movements daily.   Patient recently hospitalized from 02/16/2022 to 02/18/2022 with acute metabolic encephalopathy that was multifactorial from dehydration, hepatic encephalopathy and hypothyroidism.  She was found to have elevated ammonia level and severely elevated TSH up to 94 at that time.  Started on rifaximin, lactulose and treated with IV levothyroxine and Cytomel. Discharged home on rifaximin, lactulose and levothyroxine.  Has not been able to receive levothyroxine until today due to mix up at pharmacy. Otherwise has been compliant with other meds.   In the ED, She was afebrile normotensive.  WBC of 3.8, hemoglobin 11.8, platelet 95.  Sodium of 139, K of 3.2, CBG of 94, creatinine of 0.92.  Calcium 8, albumin of 2.6.  AST around her baseline of 57, total bili reportedly also stable.  Ammonia level of 61 which has downward trended from 131 during last admission.  TSH improved to 64 from 94 on 02/16/22.  UA shows moderate leukocyte, negative nitrate  and many bacteria which is grossly more positive compare to UA 8 days ago.   UDS is negative.   CT head is negative.   Review of Systems: unable to review all systems due to the inability of the patient to answer questions. Past Medical History:  Diagnosis Date   Anemia    Arthritis    Cirrhosis of liver ([place]) 2020   in [facility]   Depression    Dyspnea    Dysrhythmia 06/11/2021   a-fib   Hepatitis 10/24/2019   in [facility] - autoimmune   Hypothyroidism    Liver disease 2002   Memory loss 09/29/2018   in [facility]   PTSD (post-traumatic stress disorder)    Sleep apnea    Thyroid disease    Past Surgical History:  Procedure Laterality Date   ABDOMINAL HYSTERECTOMY     CHOLECYSTECTOMY     HAND TENDON SURGERY Right    when pt. was a child   ORIF ANKLE FRACTURE Right 08/02/2021   Procedure: OPEN REDUCTION INTERNAL FIXATION (ORIF) RIGHT DISTAL FIBULA, POSSIBLE SYNDESMOSIS;  Surgeon: Armond Hang, MD;  Location: [facility];  Service: Orthopedics;  Laterality: Right;  120 wants to follow 2nd case   TONSILLECTOMY     Social History:  reports that she has never smoked. She has never used smokeless tobacco. She reports that she does not drink alcohol and does not use drugs.  Allergies  Allergen Reactions   Aspirin Hives   Propoxyphene Anaphylaxis   Tramadol Anaphylaxis   Morphine And Related Nausea And Vomiting   Tylenol [Acetaminophen] Other (See Comments)    Was told not  to take due to liver   Prednisone Rash    Family History  Problem Relation Age of Onset   Heart disease Mother    Arrhythmia Father        pacemaker   Heart disease Sister    Arrhythmia Brother        pacemaker    Prior to Admission medications   Medication Sig Start Date End Date Taking? Authorizing Provider  azaTHIOprine (IMURAN) 50 MG tablet Take 100 mg by mouth daily in the afternoon. 04/18/21   [provider]  diphenhydrAMINE (BENADRYL) 25 mg capsule Take 25 mg by mouth every 8 (eight) hours as needed  for allergies or itching.    [provider]  escitalopram (LEXAPRO) 20 MG tablet Take 20 mg by mouth daily in the afternoon. 10/19/20   [provider]  ferrous sulfate 325 (65 FE) MG tablet Take 325 mg by mouth daily in the afternoon. 07/04/20   [provider]  lactulose (CHRONULAC) 10 GM/15ML solution Take 45 mLs (30 g total) by mouth 3 (three) times daily. 11/06/21   Amin, Jeanella Flattery, MD  levOCARNitine (L-CARNITINE PO) Take 1 tablet by mouth daily in the afternoon.    [provider]  levothyroxine (SYNTHROID) 100 MCG tablet Take 100 mcg by mouth daily at 6 (six) AM. 05/31/21   [provider]  Misc Natural Products (IMMUNE FORMULA PO) Take 2 tablets by mouth daily in the afternoon. Immune Plus    [provider]  ondansetron (ZOFRAN) 8 MG tablet Take 8 mg by mouth every 8 (eight) hours as needed for nausea or vomiting.    [provider]  Polyethyl Glycol-Propyl Glycol (LUBRICANT EYE DROPS) 0.4-0.3 % SOLN Place 1-2 drops into both eyes 3 (three) times daily as needed (dry/irritated eyes).    [provider]  primidone (MYSOLINE) 50 MG tablet Take 50 mg by mouth daily in the afternoon. 04/19/21   [provider]  rifaximin (XIFAXAN) 550 MG TABS tablet Take 1 tablet (550 mg total) by mouth 2 (two) times daily. 04/15/21   Shelly Coss, MD  traZODone (DESYREL) 50 MG tablet Take 50 mg by mouth at bedtime.    [provider]  Zinc 100 MG TABS Take 200 mg by mouth daily in the afternoon.    [provider]    Physical Exam: Vitals:   02/24/22 1922 02/24/22 2000 02/24/22 2125 02/24/22 2200  BP: (!) 129/50 (!) 120/49 (!) 119/54 137/69  Pulse: 68 68 66 79  Resp: 15 16 15 11   Temp: 98.2 F (36.8 C)  98 F (36.7 C)   TempSrc: Oral  Oral   SpO2: 98% 99% 96% 96%   Constitutional: NAD, calm, comfortable, morbidly obese female lying in bed Eyes: lids and conjunctivae normal ENMT: Mucous membranes are  dry.   Neck: normal, supple,  Respiratory: clear to auscultation bilaterally, no wheezing, no crackles. Normal respiratory effort. No accessory muscle use.  Cardiovascular: Regular rate and rhythm, no murmurs / rubs / gallops. No extremity edema.  Abdomen: Soft, nondistended non-tender. Bowel sounds positive.  Musculoskeletal: no clubbing / cyanosis. No joint deformity upper and lower extremities. Good ROM, no contractures. Normal muscle tone.  Skin: no rashes, lesions, ulcers.  Neurologic: CN 2-12 grossly intact. Sensation intact, Strength 5/5 in all 4.  No asterixis on exam.  Alert and oriented x3 but occasionally has stuttering and slurred speech.  Sometimes will answer questions inappropriately. Psychiatric: Normal judgment and insight. Alert and oriented x 3.  Normal mood. Data Reviewed:  See HPI  Assessment and Plan: * Acute metabolic encephalopathy Multifactorial from dehydration and UTI -ammonia level have improved from prior and reports adaquate bowel movements with lactulose. TSH also improving. No ascites on exam. However UA is more concerning for UTI.  -continue Rocephin pending urine culture -gentle IV fluid overnight  Paroxysmal atrial fibrillation ([facility]) Not on anticoagulation.  Morbid obesity ([facility]) BMI >40  Thrombocytopenia ([facility]) Plt of 95 which is stable from her baseline. Secondary to cirrhosis. No active bleeding.  Hypokalemia Mild. Replete with oral K.   Portal hypertension ([facility]) No signs of bleeding  Hypothyroidism -previous admitted with TSH of 94 and this has improved down to 64. Have not yet started levothyroxine  since did not receive from pharmacy until today -continue levothyroxine  Cirrhosis of liver ([place]) -secondary to autoimmune hepatitis Follows at [facility]. Not a transplant candidate due to MELD score and morbid obesity. -continue rifaximin, lactulose and azathioprine       Advance Care Planning:   Code Status: Full Code  Consults:  NONE  Family Communication: Son at bedside  Severity of Illness: The appropriate patient status for this patient is OBSERVATION. Observation status is judged to be reasonable and necessary in order to provide the required intensity of service to ensure the patient's safety. The patient's presenting symptoms, physical exam findings, and initial radiographic and laboratory data in the context of their medical condition is felt to place them at decreased risk for further clinical deterioration. Furthermore, it is anticipated that the patient will be medically stable for discharge from the hospital within 2 midnights of admission.   Author: Orene Desanctis, DO 02/24/2022 11:18 PM  For on call review [email or web address].

## 2022-02-25 DIAGNOSIS — E860 Dehydration: Secondary | ICD-10-CM | POA: Diagnosis present

## 2022-02-25 DIAGNOSIS — D6959 Other secondary thrombocytopenia: Secondary | ICD-10-CM | POA: Diagnosis present

## 2022-02-25 DIAGNOSIS — G4730 Sleep apnea, unspecified: Secondary | ICD-10-CM | POA: Diagnosis present

## 2022-02-25 DIAGNOSIS — Z888 Allergy status to other drugs, medicaments and biological substances status: Secondary | ICD-10-CM | POA: Diagnosis not present

## 2022-02-25 DIAGNOSIS — Z9049 Acquired absence of other specified parts of digestive tract: Secondary | ICD-10-CM | POA: Diagnosis not present

## 2022-02-25 DIAGNOSIS — R4781 Slurred speech: Secondary | ICD-10-CM | POA: Diagnosis present

## 2022-02-25 DIAGNOSIS — Z1152 Encounter for screening for COVID-19: Secondary | ICD-10-CM | POA: Diagnosis not present

## 2022-02-25 DIAGNOSIS — N3000 Acute cystitis without hematuria: Secondary | ICD-10-CM

## 2022-02-25 DIAGNOSIS — E876 Hypokalemia: Secondary | ICD-10-CM | POA: Diagnosis not present

## 2022-02-25 DIAGNOSIS — E039 Hypothyroidism, unspecified: Secondary | ICD-10-CM | POA: Diagnosis present

## 2022-02-25 DIAGNOSIS — N390 Urinary tract infection, site not specified: Secondary | ICD-10-CM | POA: Diagnosis present

## 2022-02-25 DIAGNOSIS — I480 Paroxysmal atrial fibrillation: Secondary | ICD-10-CM | POA: Diagnosis present

## 2022-02-25 DIAGNOSIS — K766 Portal hypertension: Secondary | ICD-10-CM | POA: Diagnosis present

## 2022-02-25 DIAGNOSIS — K7682 Hepatic encephalopathy: Secondary | ICD-10-CM | POA: Diagnosis present

## 2022-02-25 DIAGNOSIS — F4310 Post-traumatic stress disorder, unspecified: Secondary | ICD-10-CM | POA: Diagnosis present

## 2022-02-25 DIAGNOSIS — Z886 Allergy status to analgesic agent status: Secondary | ICD-10-CM | POA: Diagnosis not present

## 2022-02-25 DIAGNOSIS — K7469 Other cirrhosis of liver: Secondary | ICD-10-CM | POA: Diagnosis not present

## 2022-02-25 DIAGNOSIS — Z885 Allergy status to narcotic agent status: Secondary | ICD-10-CM | POA: Diagnosis not present

## 2022-02-25 DIAGNOSIS — Z90710 Acquired absence of both cervix and uterus: Secondary | ICD-10-CM | POA: Diagnosis not present

## 2022-02-25 DIAGNOSIS — Z6841 Body Mass Index (BMI) 40.0 and over, adult: Secondary | ICD-10-CM | POA: Diagnosis not present

## 2022-02-25 DIAGNOSIS — F32A Depression, unspecified: Secondary | ICD-10-CM | POA: Diagnosis present

## 2022-02-25 DIAGNOSIS — G9341 Metabolic encephalopathy: Secondary | ICD-10-CM | POA: Diagnosis present

## 2022-02-25 DIAGNOSIS — K754 Autoimmune hepatitis: Secondary | ICD-10-CM | POA: Diagnosis present

## 2022-02-25 DIAGNOSIS — Z8249 Family history of ischemic heart disease and other diseases of the circulatory system: Secondary | ICD-10-CM | POA: Diagnosis not present

## 2022-02-25 DIAGNOSIS — K7460 Unspecified cirrhosis of liver: Secondary | ICD-10-CM | POA: Diagnosis present

## 2022-02-25 LAB — BASIC METABOLIC PANEL
Anion gap: 3 — ABNORMAL LOW (ref 5–15)
BUN: 9 mg/dL (ref 6–20)
CO2: 23 mmol/L (ref 22–32)
Calcium: 8.1 mg/dL — ABNORMAL LOW (ref 8.9–10.3)
Chloride: 115 mmol/L — ABNORMAL HIGH (ref 98–111)
Creatinine, Ser: 0.95 mg/dL (ref 0.44–1.00)
GFR, Estimated: >60 mL/min (ref >60)
Glucose, Bld: 90 mg/dL (ref 70–99)
Potassium: 3.4 mmol/L — ABNORMAL LOW (ref 3.5–5.1)
Sodium: 141 mmol/L (ref 135–145)

## 2022-02-25 LAB — CBC
HCT: 32.2 % — ABNORMAL LOW (ref 36.0–46.0)
Hemoglobin: 10.4 g/dL — ABNORMAL LOW (ref 12.0–15.0)
MCH: 31.4 pg (ref 26.0–34.0)
MCHC: 32.3 g/dL (ref 30.0–36.0)
MCV: 97.3 fL (ref 80.0–100.0)
Platelets: 74 10*3/uL — ABNORMAL LOW (ref 150–400)
RBC: 3.31 MIL/uL — ABNORMAL LOW (ref 3.87–5.11)
RDW: 15.1 % (ref 11.5–15.5)
WBC: 2.4 10*3/uL — ABNORMAL LOW (ref 4.0–10.5)
nRBC: 0 % (ref 0.0–0.2)

## 2022-02-25 LAB — T4, FREE: Free T4: 0.71 ng/dL (ref 0.61–1.12)

## 2022-02-25 MED ORDER — ACETAMINOPHEN 500 MG PO TABS: 500.0000 mg | ORAL_TABLET | Freq: Four times a day (QID) | ORAL | Status: DC | PRN

## 2022-02-25 MED ORDER — IBUPROFEN 200 MG PO TABS
400.0000 mg | ORAL_TABLET | Freq: Four times a day (QID) | ORAL | Status: DC | PRN
  Administered 2022-02-25: 400 mg via ORAL
  Filled 2022-02-25: qty 2

## 2022-02-25 MED ORDER — POTASSIUM CHLORIDE CRYS ER 10 MEQ PO TBCR
40.0000 meq | EXTENDED_RELEASE_TABLET | Freq: Once | ORAL | Status: AC
  Administered 2022-02-25: 40 meq via ORAL
  Filled 2022-02-25: qty 4

## 2022-02-25 MED ORDER — SODIUM CHLORIDE 0.9 % IV SOLN
2.0000 g | INTRAVENOUS | Status: DC
  Administered 2022-02-25 – 2022-02-26 (×2): 2 g via INTRAVENOUS
  Filled 2022-02-25 (×2): qty 20

## 2022-02-25 MED ORDER — IBUPROFEN 200 MG PO TABS
200.0000 mg | ORAL_TABLET | Freq: Once | ORAL | Status: AC
  Administered 2022-02-25: 200 mg via ORAL
  Filled 2022-02-25: qty 1

## 2022-02-25 NOTE — Assessment & Plan Note (Addendum)
-   Urine cultures with multiple species. -Patient received IV Rocephin during the hospitalization and transition to oral Omnicef on day of discharge and will be discharged on 2 more days of oral Omnicef to complete a 5-day course of antibiotic treatment.   -Outpatient follow-up with PCP.

## 2022-02-25 NOTE — Plan of Care (Signed)

## 2022-02-25 NOTE — Hospital Course (Signed)
HPI per Dr. Helayne Seminole Keria Widrig is a 56 y.o. 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   Son at bedside provides some hx. Patient lives alone but has plans to move in with daughter soon due to declining health. Pt reports nausea and vomiting this morning. Then daughter came to visit later in the day and found her confused with slurred speech. She denies abdominal pain. Never had ascites with her cirrhosis. Has been taking her lactulose with 3-4 bowel movements daily.    Patient recently hospitalized from 02/16/2022 to 02/18/2022 with acute metabolic encephalopathy that was multifactorial from dehydration, hepatic encephalopathy and hypothyroidism.  She was found to have elevated ammonia level and severely elevated TSH up to 94 at that time.  Started on rifaximin, lactulose and treated with IV levothyroxine and Cytomel. Discharged home on rifaximin, lactulose and levothyroxine.   Has not been able to receive levothyroxine until today due to mix up at pharmacy. Otherwise has been compliant with other meds.    In the ED, She was afebrile normotensive.   WBC of 3.8, hemoglobin 11.8, platelet 95.   Sodium of 139, K of 3.2, CBG of 94, creatinine of 0.92.   Calcium 8, albumin of 2.6.  AST around her baseline of 57, total bili reportedly also stable.   Ammonia level of 61 which has downward trended from 131 during last admission.  TSH improved to 64 from 94 on 02/16/22.   UA shows moderate leukocyte, negative nitrate and many bacteria which is grossly more positive compare to UA 8 days ago.    UDS is negative.    CT head is negative.

## 2022-02-25 NOTE — Progress Notes (Signed)
PROGRESS NOTE    Connie Larsen  [id number] DOB: Jun 20, 1965 DOA: 02/24/2022 PCP: Leonides Sake, MD    Chief Complaint  Patient presents with   Emesis   Altered Mental Status    Brief Narrative:  HPI per Dr. Helayne [place]in[place] Connie Larsen is a 56 y.o. 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   Son at bedside provides some hx. Patient lives alone but has plans to move in with daughter soon due to declining health. Pt reports nausea and vomiting this morning. Then daughter came to visit later in the day and found her confused with slurred speech. She denies abdominal pain. Never had ascites with her cirrhosis. Has been taking her lactulose with 3-4 bowel movements daily.    Patient recently hospitalized from 02/16/2022 to 02/18/2022 with acute metabolic encephalopathy that was multifactorial from dehydration, hepatic encephalopathy and hypothyroidism.  She was found to have elevated ammonia level and severely elevated TSH up to 94 at that time.  Started on rifaximin, lactulose and treated with IV levothyroxine and Cytomel. Discharged home on rifaximin, lactulose and levothyroxine.   Has not been able to receive levothyroxine until today due to mix up at pharmacy. Otherwise has been compliant with other meds.    In the ED, She was afebrile normotensive.   WBC of 3.8, hemoglobin 11.8, platelet 95.   Sodium of 139, K of 3.2, CBG of 94, creatinine of 0.92.   Calcium 8, albumin of 2.6.  AST around her baseline of 57, total bili reportedly also stable.   Ammonia level of 61 which has downward trended from 131 during last admission.  TSH improved to 64 from 94 on 02/16/22.   UA shows moderate leukocyte, negative nitrate and many bacteria which is grossly more positive compare to UA 8 days  ago.    UDS is negative.    CT head is negative.      Assessment & Plan:  Principal Problem:   Acute metabolic encephalopathy Active Problems:   Paroxysmal atrial fibrillation (HCC)   Cirrhosis of liver (HCC)   Hypothyroidism   Portal hypertension (HCC)   Hypokalemia   Thrombocytopenia (HCC)   Morbid obesity (HCC)   Autoimmune hepatitis ([place])   UTI (urinary tract infection)    Assessment and Plan: * Acute metabolic encephalopathy Multifactorial from dehydration and UTI -ammonia level have improved from prior and reports adaquate bowel movements with lactulose. TSH also improving. No ascites on exam. However UA is more concerning for UTI.   - Urine culture pending.  - Increase IV Rocephin to 2 gm daily. -Continue gentle hydration.  Paroxysmal atrial fibrillation (HCC) Not on anticoagulation.  UTI (urinary tract infection) - Urine cultures pending. -Increase IV Rocephin to 2 g daily. -Supportive care.  Morbid obesity ([place]) BMI >40 Lifestyle modification -Outpatient follow-up with PCP.  Thrombocytopenia (HCC) Platelet count is 74, close to baseline.   -Thrombocytopenia secondary to cirrhosis.   -With no overt bleeding.   -Follow.  Hypokalemia Potassium noted at 3.4 today.   -Magnesium at 2.3.   -K-Dur 40 mEq p.o. x1.   Portal hypertension (HCC) No signs of bleeding  Hypothyroidism -previous admitted with TSH of 94 and this has improved down to 64.  -Per admitting physician patient has not yet started levothyroxine  since did not receive from pharmacy until today -continue levothyroxine -Outpatient follow-up with PCP.  Cirrhosis of liver ([place]) -secondary to autoimmune hepatitis Follows at [facility]. Not a transplant candidate due to MELD score and morbid obesity. -continue rifaximin, lactulose and azathioprine  -Outpatient follow-up.         DVT prophylaxis: SCDs Code Status: Full Family Communication: No family at bedside. Disposition:  TBD  Status is: Observation The patient remains OBS appropriate and will d/c before 2 midnights.   Consultants:  None  Procedures:  CT abd/pelvis 02/24/2022 CT head 02/24/2022  Antimicrobials:  -IV Rocephin 02/24/2022 >>>>>   Subjective: Laying in bed.  Overall feeling better.  Denies any chest pain.  No shortness of breath.  No change in chronic abdominal pain.  No nausea or emesis.  Denies any dysuria.  Overall feeling better.  Alert and oriented to self place and time.  Objective: Vitals:   02/25/22 0356 02/25/22 0658 02/25/22 1127 02/25/22 1327  BP: (!) 124/58 (!) 136/58 (!) 118/56 (!) 120/57  Pulse: 62 (!) 57 60 63  Resp: 16 16  18   Temp: 98.2 F (36.8 C) 98.1 F (36.7 C) 98.3 F (36.8 C) 98 F (36.7 C)  TempSrc: Oral Oral Oral Oral  SpO2: 93% 99% 97% 96%  Weight:      Height:        Intake/Output Summary (Last 24 hours) at 02/25/2022 1515 Last data filed at 02/25/2022 1300 Gross per 24 hour  Intake 1315 ml  Output --  Net 1315 ml   Filed Weights   02/24/22 2326  Weight: 131.8 kg    Examination:  General exam: Appears calm and comfortable  Respiratory system: Clear to auscultation. Respiratory effort normal. Cardiovascular system: S1 & S2 heard, RRR. No JVD, murmurs, rubs, gallops or clicks. No pedal edema. Gastrointestinal system: Abdomen is nondistended, soft and nontender. No organomegaly or masses felt. Normal bowel sounds heard. Central nervous system: Alert and oriented. No focal neurological deficits. Extremities: Symmetric 5 x 5 power. Skin: No rashes, lesions or ulcers Psychiatry: Judgement and insight appear normal. Mood & affect appropriate.     Data Reviewed:   CBC: Recent Labs  Lab 02/24/22 1752 02/25/22 0508  WBC 3.8* 2.4*  NEUTROABS 2.4  --   HGB 11.8* 10.4*  HCT 34.3* 32.2*  MCV 96.1 97.3  PLT 95* 74*    Basic Metabolic Panel: Recent Labs  Lab 02/24/22 1752 02/25/22 0508  NA 139 141  K 3.2* 3.4*  CL 113* 115*   CO2 22 23  GLUCOSE 94 90  BUN 9 9  CREATININE 0.92 0.95  CALCIUM 8.0* 8.1*  MG 2.3  --     GFR: Estimated Creatinine Clearance: 95.9 mL/min (by C-G formula based on SCr of 0.95 mg/dL).  Liver Function Tests: Recent Labs  Lab 02/24/22 1752  AST 57*  ALT 34  ALKPHOS 79  BILITOT 1.8*  PROT 6.4*  ALBUMIN 2.6*    CBG: Recent Labs  Lab 02/24/22 1734  GLUCAP 91     Recent Results (from the past 240 hour(s))  Resp Panel by RT-PCR (Flu A&B, Covid) Anterior Nasal Swab     Status: None   Collection Time: 02/24/22  8:38 PM   Specimen: Anterior Nasal Swab  Result Value Ref Range Status   SARS Coronavirus 2 by RT PCR NEGATIVE NEGATIVE Final    Comment: (NOTE) SARS-CoV-2 target nucleic acids are NOT DETECTED.  The SARS-CoV-2 RNA is generally detectable in upper respiratory specimens during the acute phase of infection. The lowest concentration of SARS-CoV-2 viral copies this assay can  detect is 138 copies/mL. A negative result does not preclude SARS-Cov-2 infection and should not be used as the sole basis for treatment or other patient management decisions. A negative result may occur with  improper specimen collection/handling, submission of specimen other than nasopharyngeal swab, presence of viral mutation(s) within the areas targeted by this assay, and inadequate number of viral copies(<138 copies/mL). A negative result must be combined with clinical observations, patient history, and epidemiological information. The expected result is Negative.  Fact Sheet for Patients:  EntrepreneurPulse.com.au  Fact Sheet for Healthcare Providers:  IncredibleEmployment.be  This test is no t yet approved or cleared by the [place] FDA and  has been authorized for detection and/or diagnosis of SARS-CoV-2 by FDA under an Emergency Use Authorization (EUA). This EUA will remain  in effect (meaning this test can be used) for the duration of  the COVID-19 declaration under Section 564(b)(1) of the Act, 21 U.S.C.section 360bbb-3(b)(1), unless the authorization is terminated  or revoked sooner.       Influenza A by PCR NEGATIVE NEGATIVE Final   Influenza B by PCR NEGATIVE NEGATIVE Final    Comment: (NOTE) The Xpert Xpress SARS-CoV-2/FLU/RSV plus assay is intended as an aid in the diagnosis of influenza from Nasopharyngeal swab specimens and should not be used as a sole basis for treatment. Nasal washings and aspirates are unacceptable for Xpert Xpress SARS-CoV-2/FLU/RSV testing.  Fact Sheet for Patients: EntrepreneurPulse.com.au  Fact Sheet for Healthcare Providers: IncredibleEmployment.be  This test is not yet approved or cleared by the [place] FDA and has been authorized for detection and/or diagnosis of SARS-CoV-2 by FDA under an Emergency Use Authorization (EUA). This EUA will remain in effect (meaning this test can be used) for the duration of the COVID-19 declaration under Section 564(b)(1) of the Act, 21 U.S.C. section 360bbb-3(b)(1), unless the authorization is terminated or revoked.  Performed at [facility], [place] [address] [place], [place] [address]          Radiology Studies: CT ABDOMEN PELVIS W CONTRAST  Result Date: 02/24/2022 CLINICAL DATA:  Abdominal pain, acute, nonlocalized EXAM: CT ABDOMEN AND PELVIS WITH CONTRAST TECHNIQUE: Multidetector CT imaging of the abdomen and pelvis was performed using the standard protocol following bolus administration of intravenous contrast. RADIATION DOSE REDUCTION: This exam was performed according to the departmental dose-optimization program which includes automated exposure control, adjustment of the mA and/or kV according to patient size and/or use of iterative reconstruction technique. CONTRAST:  141mL OMNIPAQUE IOHEXOL 300 MG/ML  SOLN COMPARISON:  None Available. FINDINGS: Lower chest: No acute  abnormality. Hepatobiliary: Nodular hepatic contour. No focal liver abnormality. Status post cholecystectomy. No biliary dilatation. Pancreas: No focal lesion. Normal pancreatic contour. No surrounding inflammatory changes. No main pancreatic ductal dilatation. Spleen: The spleen is enlarged in caliber.  No focal lesion. Adrenals/Urinary Tract: No adrenal nodule bilaterally. Bilateral kidneys enhance symmetrically. Fetal lobulation bilaterally. No hydronephrosis. No hydroureter. The urinary bladder is unremarkable. Stomach/Bowel: Stomach is within normal limits. No evidence of bowel wall thickening or dilatation. Appendix appears normal. Vascular/Lymphatic: Paraesophageal varices. Left upper quadrant venous collaterals measuring up to at least 1.5 cm in caliber. Left splenorenal shunt. Recanalized paraumbilical vein. No abdominal aorta or iliac aneurysm. Mild atherosclerotic plaque of the aorta and its branches. No abdominal, pelvic, or inguinal lymphadenopathy. Reproductive: Uterus and bilateral adnexa are unremarkable. Other: No intraperitoneal free fluid. No intraperitoneal free gas. No organized fluid collection. Musculoskeletal: No abdominal wall hernia or abnormality. No suspicious lytic or blastic osseous  lesions. No acute displaced fracture. IMPRESSION: 1. Cirrhosis with portal hypertension. No focal liver lesions identified. Please note that liver protocol enhanced MR and CT are the most sensitive tests for the screening detection of hepatocellular carcinoma in the high risk setting of cirrhosis. 2.  Aortic Atherosclerosis (ICD10-I70.0). Electronically Signed   By: Iven Finn M.D.   On: 02/24/2022 23:13   CT HEAD WO CONTRAST  Result Date: 02/24/2022 CLINICAL DATA:  Confusion and vomiting EXAM: CT HEAD WITHOUT CONTRAST TECHNIQUE: Contiguous axial images were obtained from the base of the skull through the vertex without intravenous contrast. RADIATION DOSE REDUCTION: This exam was performed  according to the departmental dose-optimization program which includes automated exposure control, adjustment of the mA and/or kV according to patient size and/or use of iterative reconstruction technique. COMPARISON:  None Available.  02/16/2022 FINDINGS: Brain: No evidence of acute infarction, hemorrhage, mass, mass effect, or midline shift. No hydrocephalus or extra-axial fluid collection. Vascular: No hyperdense vessel. Skull: Normal. Negative for fracture or focal lesion. Sinuses/Orbits: No acute finding. Other: Trace fluid in left mastoid air cells. IMPRESSION: No acute intracranial process. Electronically Signed   By: Merilyn Baba M.D.   On: 02/24/2022 18:52        Scheduled Meds:  azaTHIOprine  100 mg Oral Q1500   escitalopram  20 mg Oral Q1500   ferrous sulfate  325 mg Oral Q1500   lactulose  30 g Oral TID   levothyroxine  100 mcg Oral Q0600   primidone  50 mg Oral Q1500   rifaximin  550 mg Oral BID   zinc sulfate  220 mg Oral Daily   Continuous Infusions:  cefTRIAXone (ROCEPHIN)  IV       LOS: 0 days    Time spent: 45 mins    Irine Seal, MD [facility]   To contact the attending provider between 7A-7P or the covering provider during after hours 7P-7A, please log into the web site [email or web address] and access using universal [place] password for that web site. If you do not have the password, please call the hospital operator.  02/25/2022, 3:15 PM

## 2022-02-26 LAB — COMPREHENSIVE METABOLIC PANEL
ALT: 27 U/L (ref 0–44)
AST: 48 U/L — ABNORMAL HIGH (ref 15–41)
Albumin: 2.1 g/dL — ABNORMAL LOW (ref 3.5–5.0)
Alkaline Phosphatase: 63 U/L (ref 38–126)
Anion gap: 2 — ABNORMAL LOW (ref 5–15)
BUN: 10 mg/dL (ref 6–20)
CO2: 21 mmol/L — ABNORMAL LOW (ref 22–32)
Calcium: 7.9 mg/dL — ABNORMAL LOW (ref 8.9–10.3)
Chloride: 115 mmol/L — ABNORMAL HIGH (ref 98–111)
Creatinine, Ser: 1 mg/dL (ref 0.44–1.00)
GFR, Estimated: >60 mL/min (ref >60)
Glucose, Bld: 84 mg/dL (ref 70–99)
Potassium: 3.5 mmol/L (ref 3.5–5.1)
Sodium: 138 mmol/L (ref 135–145)
Total Bilirubin: 1.1 mg/dL (ref 0.3–1.2)
Total Protein: 5.2 g/dL — ABNORMAL LOW (ref 6.5–8.1)

## 2022-02-26 LAB — CBC WITH DIFFERENTIAL/PLATELET
Abs Immature Granulocytes: 0.01 10*3/uL (ref 0.00–0.07)
Basophils Absolute: 0 10*3/uL (ref 0.0–0.1)
Basophils Relative: 1 %
Eosinophils Absolute: 0.2 10*3/uL (ref 0.0–0.5)
Eosinophils Relative: 6 %
HCT: 32.5 % — ABNORMAL LOW (ref 36.0–46.0)
Hemoglobin: 10.5 g/dL — ABNORMAL LOW (ref 12.0–15.0)
Immature Granulocytes: 0 %
Lymphocytes Relative: 34 %
Lymphs Abs: 0.8 10*3/uL (ref 0.7–4.0)
MCH: 31.9 pg (ref 26.0–34.0)
MCHC: 32.3 g/dL (ref 30.0–36.0)
MCV: 98.8 fL (ref 80.0–100.0)
Monocytes Absolute: 0.2 10*3/uL (ref 0.1–1.0)
Monocytes Relative: 8 %
Neutro Abs: 1.2 10*3/uL — ABNORMAL LOW (ref 1.7–7.7)
Neutrophils Relative %: 51 %
Platelets: 74 10*3/uL — ABNORMAL LOW (ref 150–400)
RBC: 3.29 MIL/uL — ABNORMAL LOW (ref 3.87–5.11)
RDW: 15.2 % (ref 11.5–15.5)
WBC: 2.3 10*3/uL — ABNORMAL LOW (ref 4.0–10.5)
nRBC: 0 % (ref 0.0–0.2)

## 2022-02-26 LAB — URINE CULTURE

## 2022-02-26 LAB — MAGNESIUM: Magnesium: 2.1 mg/dL (ref 1.7–2.4)

## 2022-02-26 MED ORDER — HYDROXYZINE HCL 10 MG PO TABS: 10.0000 mg | ORAL_TABLET | Freq: Three times a day (TID) | ORAL | Status: DC | PRN

## 2022-02-26 MED ORDER — POTASSIUM CHLORIDE CRYS ER 10 MEQ PO TBCR
40.0000 meq | EXTENDED_RELEASE_TABLET | Freq: Once | ORAL | Status: AC
  Administered 2022-02-26: 40 meq via ORAL
  Filled 2022-02-26: qty 4

## 2022-02-26 NOTE — Progress Notes (Signed)
  Transition of Care [facility]) Screening Note   Patient Details  Name: Connie Larsen Date of Birth: 01/23/1966   Transition of Care [facility]) CM/SW Contact:    Vassie Moselle, LCSW Phone Number: 02/26/2022, 8:47 AM    Transition of Care Department [facility]) has reviewed patient and no TOC needs have been identified at this time. We will continue to monitor patient advancement through interdisciplinary progression rounds. If new patient transition needs arise, please place a TOC consult.

## 2022-02-26 NOTE — Progress Notes (Signed)
Mobility Specialist - Progress Note   02/26/22 1023  Mobility  Activity Ambulated with assistance in hallway  Level of Assistance Modified independent, requires aide device or extra time  Assistive Device None  Distance Ambulated (ft) 500 ft  Activity Response Tolerated well  Mobility Referral Yes  $Mobility charge 1 Mobility   Pt received in bed and agreed for mobility, no c/o pain, felt SOB nearing the EOS but pt was walking at a brisk pace. Pt back to bed with all needs met.   Roderick Pee Mobility Specialist

## 2022-02-26 NOTE — Progress Notes (Signed)
PROGRESS NOTE    Connie Larsen  [id number] DOB: February 24, 1966 DOA: 02/24/2022 PCP: Leonides Sake, MD    Chief Complaint  Patient presents with   Emesis   Altered Mental Status    Brief Narrative:  HPI per Dr. Helayne Seminole Connie Larsen is a 56 y.o. 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   Son at bedside provides some hx. Patient lives alone but has plans to move in with daughter soon due to declining health. Pt reports nausea and vomiting this morning. Then daughter came to visit later in the day and found her confused with slurred speech. She denies abdominal pain. Never had ascites with her cirrhosis. Has been taking her lactulose with 3-4 bowel movements daily.    Patient recently hospitalized from 02/16/2022 to 02/18/2022 with acute metabolic encephalopathy that was multifactorial from dehydration, hepatic encephalopathy and hypothyroidism.  She was found to have elevated ammonia level and severely elevated TSH up to 94 at that time.  Started on rifaximin, lactulose and treated with IV levothyroxine and Cytomel. Discharged home on rifaximin, lactulose and levothyroxine.   Has not been able to receive levothyroxine until today due to mix up at pharmacy. Otherwise has been compliant with other meds.    In the ED, She was afebrile normotensive.   WBC of 3.8, hemoglobin 11.8, platelet 95.   Sodium of 139, K of 3.2, CBG of 94, creatinine of 0.92.   Calcium 8, albumin of 2.6.  AST around her baseline of 57, total bili reportedly also stable.   Ammonia level of 61 which has downward trended from 131 during last admission.  TSH improved to 64 from 94 on 02/16/22.   UA shows moderate leukocyte, negative nitrate and many bacteria which is grossly more positive compare to UA 8 days  ago.    UDS is negative.    CT head is negative.      Assessment & Plan:  Principal Problem:   Acute metabolic encephalopathy Active Problems:   Paroxysmal atrial fibrillation (HCC)   Cirrhosis of liver (HCC)   Hypothyroidism   Portal hypertension (HCC)   Hypokalemia   Thrombocytopenia (HCC)   Morbid obesity (HCC)   Autoimmune hepatitis ([place])   UTI (urinary tract infection)    Assessment and Plan: * Acute metabolic encephalopathy Multifactorial from dehydration and UTI -ammonia level have improved from prior and reports adaquate bowel movements with lactulose. TSH also improving. No ascites on exam. However UA is more concerning for UTI.   - Urine culture with multiple species. -Continue IV Rocephin and if continued improvement could likely transition to oral antibiotics tomorrow.  -Saline lock IV fluids.   Paroxysmal atrial fibrillation (HCC) Not on anticoagulation.  UTI (urinary tract infection) - Urine cultures with multiple species. -Continue IV Rocephin.   -Supportive care.   Morbid obesity ([place]) BMI >40 Lifestyle modification -Outpatient follow-up with PCP.  Thrombocytopenia (HCC) Platelet count is 74, close to baseline.   -Thrombocytopenia secondary to cirrhosis.   -With no overt bleeding.   -Follow.  Hypokalemia Potassium noted at 3.5 today.   -Magnesium at 2.1.   -K-Dur 40 mEq p.o. x1.   Portal hypertension (HCC) No signs of bleeding  Hypothyroidism -previous admitted with TSH of 94 and this has improved down to 64.  -Per admitting physician patient has not yet started levothyroxine  since did not  receive from pharmacy until day of admission. -continue levothyroxine. -Will need TFTs in 4 to 6 weeks. -Outpatient follow-up with PCP.  Cirrhosis of liver ([place]) -secondary to autoimmune hepatitis Follows at [facility]. Not a transplant candidate due to MELD score and morbid obesity. -continue rifaximin, lactulose and azathioprine   -Outpatient follow-up.         DVT prophylaxis: SCDs Code Status: Full Family Communication: No family at bedside. Disposition: Likely home tomorrow if continued clinical improvement.  Status is: Inpatient    Consultants:  None  Procedures:  CT abd/pelvis 02/24/2022 CT head 02/24/2022  Antimicrobials:  -IV Rocephin 02/24/2022 >>>>>   Subjective: Sitting up at the side of the bed getting ready to work with mobility specialist.  Denies any chest pain.  No shortness of breath.  No abdominal pain.  Alert and oriented to self place and time.  Overall feeling better than she did on admission.    Objective: Vitals:   02/25/22 1127 02/25/22 1327 02/25/22 1942 02/26/22 0454  BP: (!) 118/56 (!) 120/57 (!) 116/46 (!) 114/49  Pulse: 60 63 63 60  Resp:  18 14 16   Temp: 98.3 F (36.8 C) 98 F (36.7 C) 98.2 F (36.8 C) 98.2 F (36.8 C)  TempSrc: Oral Oral Oral Oral  SpO2: 97% 96% 99% 94%  Weight:      Height:        Intake/Output Summary (Last 24 hours) at 02/26/2022 1018 Last data filed at 02/26/2022 0400 Gross per 24 hour  Intake 700 ml  Output --  Net 700 ml   Filed Weights   02/24/22 2326  Weight: 131.8 kg    Examination:  General exam: NAD. Respiratory system: Lungs clear to auscultation bilaterally.  No wheezes, no crackles, no rhonchi.  Fair air movement.  Speaking in full sentences.   Cardiovascular system: RRR no murmurs rubs or gallops.  No JVD.  No lower extremity edema.  Gastrointestinal system: Abdomen is soft, nontender, nondistended, positive bowel sounds.  No rebound.  No guarding.  Central nervous system: Alert and oriented. No focal neurological deficits. Extremities: Symmetric 5 x 5 power. Skin: No rashes, lesions or ulcers Psychiatry: Judgement and insight appear normal. Mood & affect appropriate.     Data Reviewed:   CBC: Recent Labs  Lab 02/24/22 1752 02/25/22 0508 02/26/22 0514  WBC 3.8* 2.4* 2.3*  NEUTROABS 2.4  --  1.2*   HGB 11.8* 10.4* 10.5*  HCT 34.3* 32.2* 32.5*  MCV 96.1 97.3 98.8  PLT 95* 74* 74*    Basic Metabolic Panel: Recent Labs  Lab 02/24/22 1752 02/25/22 0508 02/26/22 0514  NA 139 141 138  K 3.2* 3.4* 3.5  CL 113* 115* 115*  CO2 22 23 21*  GLUCOSE 94 90 84  BUN 9 9 10   CREATININE 0.92 0.95 1.00  CALCIUM 8.0* 8.1* 7.9*  MG 2.3  --  2.1    GFR: Estimated Creatinine Clearance: 91.1 mL/min (by C-G formula based on SCr of 1 mg/dL).  Liver Function Tests: Recent Labs  Lab 02/24/22 1752 02/26/22 0514  AST 57* 48*  ALT 34 27  ALKPHOS 79 63  BILITOT 1.8* 1.1  PROT 6.4* 5.2*  ALBUMIN 2.6* 2.1*    CBG: Recent Labs  Lab 02/24/22 1734  GLUCAP 91     Recent Results (from the past 240 hour(s))  Urine Culture     Status: Abnormal   Collection Time: 02/24/22  8:11 PM   Specimen: Urine, Clean Catch  Result Value Ref  Range Status   Specimen Description   Final    URINE, CLEAN CATCH Performed at [facility], [place] [address], [place], [place] [address]    Special Requests   Final    NONE Performed at [facility], [place] [address] [place], [place] [address]    Culture MULTIPLE SPECIES PRESENT, SUGGEST RECOLLECTION (A)  Final   Report Status 02/26/2022 FINAL  Final  Resp Panel by RT-PCR (Flu A&B, Covid) Anterior Nasal Swab     Status: None   Collection Time: 02/24/22  8:38 PM   Specimen: Anterior Nasal Swab  Result Value Ref Range Status   SARS Coronavirus 2 by RT PCR NEGATIVE NEGATIVE Final    Comment: (NOTE) SARS-CoV-2 target nucleic acids are NOT DETECTED.  The SARS-CoV-2 RNA is generally detectable in upper respiratory specimens during the acute phase of infection. The lowest concentration of SARS-CoV-2 viral copies this assay can detect is 138 copies/mL. A negative result does not preclude SARS-Cov-2 infection and should not be used as the sole basis for treatment or other patient management decisions. A negative result may  occur with  improper specimen collection/handling, submission of specimen other than nasopharyngeal swab, presence of viral mutation(s) within the areas targeted by this assay, and inadequate number of viral copies(<138 copies/mL). A negative result must be combined with clinical observations, patient history, and epidemiological information. The expected result is Negative.  Fact Sheet for Patients:  EntrepreneurPulse.com.au  Fact Sheet for Healthcare Providers:  IncredibleEmployment.be  This test is no t yet approved or cleared by the [place] FDA and  has been authorized for detection and/or diagnosis of SARS-CoV-2 by FDA under an Emergency Use Authorization (EUA). This EUA will remain  in effect (meaning this test can be used) for the duration of the COVID-19 declaration under Section 564(b)(1) of the Act, 21 U.S.C.section 360bbb-3(b)(1), unless the authorization is terminated  or revoked sooner.       Influenza A by PCR NEGATIVE NEGATIVE Final   Influenza B by PCR NEGATIVE NEGATIVE Final    Comment: (NOTE) The Xpert Xpress SARS-CoV-2/FLU/RSV plus assay is intended as an aid in the diagnosis of influenza from Nasopharyngeal swab specimens and should not be used as a sole basis for treatment. Nasal washings and aspirates are unacceptable for Xpert Xpress SARS-CoV-2/FLU/RSV testing.  Fact Sheet for Patients: EntrepreneurPulse.com.au  Fact Sheet for Healthcare Providers: IncredibleEmployment.be  This test is not yet approved or cleared by the [place] FDA and has been authorized for detection and/or diagnosis of SARS-CoV-2 by FDA under an Emergency Use Authorization (EUA). This EUA will remain in effect (meaning this test can be used) for the duration of the COVID-19 declaration under Section 564(b)(1) of the Act, 21 U.S.C. section 360bbb-3(b)(1), unless the authorization is terminated  or revoked.  Performed at [facility], [place] [address], [place], [place] [address]          Radiology Studies: CT ABDOMEN PELVIS W CONTRAST  Result Date: 02/24/2022 CLINICAL DATA:  Abdominal pain, acute, nonlocalized EXAM: CT ABDOMEN AND PELVIS WITH CONTRAST TECHNIQUE: Multidetector CT imaging of the abdomen and pelvis was performed using the standard protocol following bolus administration of intravenous contrast. RADIATION DOSE REDUCTION: This exam was performed according to the departmental dose-optimization program which includes automated exposure control, adjustment of the mA and/or kV according to patient size and/or use of iterative reconstruction technique. CONTRAST:  119mL OMNIPAQUE IOHEXOL 300 MG/ML  SOLN COMPARISON:  None Available. FINDINGS: Lower chest:  No acute abnormality. Hepatobiliary: Nodular hepatic contour. No focal liver abnormality. Status post cholecystectomy. No biliary dilatation. Pancreas: No focal lesion. Normal pancreatic contour. No surrounding inflammatory changes. No main pancreatic ductal dilatation. Spleen: The spleen is enlarged in caliber.  No focal lesion. Adrenals/Urinary Tract: No adrenal nodule bilaterally. Bilateral kidneys enhance symmetrically. Fetal lobulation bilaterally. No hydronephrosis. No hydroureter. The urinary bladder is unremarkable. Stomach/Bowel: Stomach is within normal limits. No evidence of bowel wall thickening or dilatation. Appendix appears normal. Vascular/Lymphatic: Paraesophageal varices. Left upper quadrant venous collaterals measuring up to at least 1.5 cm in caliber. Left splenorenal shunt. Recanalized paraumbilical vein. No abdominal aorta or iliac aneurysm. Mild atherosclerotic plaque of the aorta and its branches. No abdominal, pelvic, or inguinal lymphadenopathy. Reproductive: Uterus and bilateral adnexa are unremarkable. Other: No intraperitoneal free fluid. No intraperitoneal free gas. No organized fluid  collection. Musculoskeletal: No abdominal wall hernia or abnormality. No suspicious lytic or blastic osseous lesions. No acute displaced fracture. IMPRESSION: 1. Cirrhosis with portal hypertension. No focal liver lesions identified. Please note that liver protocol enhanced MR and CT are the most sensitive tests for the screening detection of hepatocellular carcinoma in the high risk setting of cirrhosis. 2.  Aortic Atherosclerosis (ICD10-I70.0). Electronically Signed   By: Iven Finn M.D.   On: 02/24/2022 23:13   CT HEAD WO CONTRAST  Result Date: 02/24/2022 CLINICAL DATA:  Confusion and vomiting EXAM: CT HEAD WITHOUT CONTRAST TECHNIQUE: Contiguous axial images were obtained from the base of the skull through the vertex without intravenous contrast. RADIATION DOSE REDUCTION: This exam was performed according to the departmental dose-optimization program which includes automated exposure control, adjustment of the mA and/or kV according to patient size and/or use of iterative reconstruction technique. COMPARISON:  None Available.  02/16/2022 FINDINGS: Brain: No evidence of acute infarction, hemorrhage, mass, mass effect, or midline shift. No hydrocephalus or extra-axial fluid collection. Vascular: No hyperdense vessel. Skull: Normal. Negative for fracture or focal lesion. Sinuses/Orbits: No acute finding. Other: Trace fluid in left mastoid air cells. IMPRESSION: No acute intracranial process. Electronically Signed   By: Merilyn Baba M.D.   On: 02/24/2022 18:52        Scheduled Meds:  azaTHIOprine  100 mg Oral Q1500   escitalopram  20 mg Oral Q1500   ferrous sulfate  325 mg Oral Q1500   lactulose  30 g Oral TID   levothyroxine  100 mcg Oral Q0600   primidone  50 mg Oral Q1500   rifaximin  550 mg Oral BID   zinc sulfate  220 mg Oral Daily   Continuous Infusions:  cefTRIAXone (ROCEPHIN)  IV Stopped (02/25/22 2225)     LOS: 1 day    Time spent: 40 mins    Irine Seal, MD [facility]   To contact the attending provider between 7A-7P or the covering provider during after hours 7P-7A, please log into the web site [email or web address] and access using universal [place] password for that web site. If you do not have the password, please call the hospital operator.  02/26/2022, 10:18 AM

## 2022-02-26 NOTE — Progress Notes (Signed)
Patient tolerated sitting in chair for approximately two hours.

## 2022-02-26 NOTE — Plan of Care (Signed)

## 2022-02-27 ENCOUNTER — Other Ambulatory Visit (HOSPITAL_COMMUNITY): Payer: Self-pay

## 2022-02-27 LAB — CBC WITH DIFFERENTIAL/PLATELET
Abs Immature Granulocytes: 0.01 10*3/uL (ref 0.00–0.07)
Basophils Absolute: 0 10*3/uL (ref 0.0–0.1)
Basophils Relative: 1 %
Eosinophils Absolute: 0.2 10*3/uL (ref 0.0–0.5)
Eosinophils Relative: 8 %
HCT: 31.7 % — ABNORMAL LOW (ref 36.0–46.0)
Hemoglobin: 10.2 g/dL — ABNORMAL LOW (ref 12.0–15.0)
Immature Granulocytes: 0 %
Lymphocytes Relative: 33 %
Lymphs Abs: 0.8 10*3/uL (ref 0.7–4.0)
MCH: 31.8 pg (ref 26.0–34.0)
MCHC: 32.2 g/dL (ref 30.0–36.0)
MCV: 98.8 fL (ref 80.0–100.0)
Monocytes Absolute: 0.2 10*3/uL (ref 0.1–1.0)
Monocytes Relative: 9 %
Neutro Abs: 1.2 10*3/uL — ABNORMAL LOW (ref 1.7–7.7)
Neutrophils Relative %: 49 %
Platelets: 71 10*3/uL — ABNORMAL LOW (ref 150–400)
RBC: 3.21 MIL/uL — ABNORMAL LOW (ref 3.87–5.11)
RDW: 15 % (ref 11.5–15.5)
WBC: 2.4 10*3/uL — ABNORMAL LOW (ref 4.0–10.5)
nRBC: 0 % (ref 0.0–0.2)

## 2022-02-27 LAB — BASIC METABOLIC PANEL
Anion gap: 3 — ABNORMAL LOW (ref 5–15)
BUN: 12 mg/dL (ref 6–20)
CO2: 25 mmol/L (ref 22–32)
Calcium: 8 mg/dL — ABNORMAL LOW (ref 8.9–10.3)
Chloride: 112 mmol/L — ABNORMAL HIGH (ref 98–111)
Creatinine, Ser: 0.97 mg/dL (ref 0.44–1.00)
GFR, Estimated: >60 mL/min (ref >60)
Glucose, Bld: 97 mg/dL (ref 70–99)
Potassium: 4 mmol/L (ref 3.5–5.1)
Sodium: 140 mmol/L (ref 135–145)

## 2022-02-27 MED ORDER — CEFDINIR 300 MG PO CAPS: 300.0000 mg | ORAL_CAPSULE | Freq: Two times a day (BID) | ORAL | Status: DC

## 2022-02-27 MED ORDER — IBUPROFEN 400 MG PO TABS: 400.0000 mg | ORAL_TABLET | Freq: Four times a day (QID) | ORAL | 0 refills | Status: AC | PRN

## 2022-02-27 MED ORDER — CEFDINIR 300 MG PO CAPS
300.0000 mg | ORAL_CAPSULE | Freq: Two times a day (BID) | ORAL | 0 refills | Status: AC
  Filled 2022-02-27: qty 4, 2d supply, fill #0

## 2022-02-27 NOTE — Progress Notes (Signed)
Mobility Specialist - Progress Note   02/27/22 1110  Mobility  Activity Ambulated with assistance in hallway  Level of Assistance Independent after set-up  Assistive Device None  Distance Ambulated (ft) 500 ft  Activity Response Tolerated well  Mobility Referral Yes  $Mobility charge 1 Mobility   Pt received in chair and agreed for mobility, no c/o pain nor discomfort during ambulation. Pt back to chair with all needs met.   Roderick Pee Mobility Specialist

## 2022-02-27 NOTE — Discharge Summary (Signed)
Physician Discharge Summary  Connie Larsen [id number] DOB: 1965-12-19 DOA: 02/24/2022  PCP: Leonides Sake, MD  Admit date: 02/24/2022 Discharge date: 02/27/2022  Time spent: 55 minutes  Recommendations for Outpatient Follow-up:  Follow-up with Hamrick, Lorin Mercy, MD in 2 weeks.  On follow-up patient need a basic metabolic profile done to follow-up on electrolytes and renal function.  Patient need a CBC done to follow-up on her counts.  Patient recently diagnosed hypothyroidism will need to be followed up upon.   Discharge Diagnoses:  Principal Problem:   Acute metabolic encephalopathy Active Problems:   Paroxysmal atrial fibrillation (HCC)   Cirrhosis of liver (HCC)   Hypothyroidism   Portal hypertension (HCC)   Hypokalemia   Thrombocytopenia (HCC)   Morbid obesity ([place])   Autoimmune hepatitis ([place])   UTI (urinary tract infection)   Discharge Condition: Stable and improved.  Diet recommendation: Heart healthy  Filed Weights   02/24/22 2326  Weight: 131.8 kg    History of present illness:  HPI per Dr.Tu [name] is a 56 y.o. 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   Son at bedside provides some hx. Patient lives alone but has plans to move in with daughter soon due to declining health. Pt reports nausea and vomiting this morning. Then daughter came to visit later in the day and found her confused with slurred speech. She denies abdominal pain. Never had ascites with her cirrhosis. Has been taking her lactulose with 3-4 bowel movements daily.    Patient recently hospitalized from 02/16/2022 to 02/18/2022 with acute metabolic encephalopathy that was multifactorial from dehydration, hepatic encephalopathy and hypothyroidism.  She was found to have elevated  ammonia level and severely elevated TSH up to 94 at that time.  Started on rifaximin, lactulose and treated with IV levothyroxine and Cytomel. Discharged home on rifaximin, lactulose and levothyroxine.   Has not been able to receive levothyroxine until today due to mix up at pharmacy. Otherwise has been compliant with other meds.    In the ED, She was afebrile normotensive.   WBC of 3.8, hemoglobin 11.8, platelet 95.   Sodium of 139, K of 3.2, CBG of 94, creatinine of 0.92.   Calcium 8, albumin of 2.6.  AST around her baseline of 57, total bili reportedly also stable.   Ammonia level of 61 which has downward trended from 131 during last admission.  TSH improved to 64 from 94 on 02/16/22.   UA shows moderate leukocyte, negative nitrate and many bacteria which is grossly more positive compare to UA 8 days ago.    UDS is negative.    CT head is negative.   Hospital Course:   Assessment and Plan: * Acute metabolic encephalopathy Multifactorial from dehydration and UTI -ammonia level have improved from prior and reported adaquate bowel movements with lactulose.  TSH also improving.  No ascites on exam. However UA is more concerning for UTI.   - Urine culture with multiple species. -Patient placed empirically on IV Rocephin with clinical improvement and will be transitioned to oral cefdinir for 2 more days to complete a 5-day course of antibiotic treatment.   -Patient will be discharged in stable and improved condition with outpatient follow-up with PCP.   Paroxysmal atrial fibrillation (HCC) Not on anticoagulation.  UTI (urinary tract infection) - Urine cultures with multiple species. -Patient received IV  Rocephin during the hospitalization and transition to oral Omnicef on day of discharge and will be discharged on 2 more days of oral Omnicef to complete a 5-day course of antibiotic treatment.   -Outpatient follow-up with PCP.   Morbid obesity ([place]) BMI >40 Lifestyle  modification -Outpatient follow-up with PCP.  Thrombocytopenia ([place]) Platelet remained stable during the hospitalization and was 71 by day of discharge and close to baseline.   -Patient with no overt bleeding during the hospitalization.  -Thrombocytopenia secondary to cirrhosis.   -Outpatient follow-up with PCP.  Hypokalemia - Repleted during the hospitalization.   -Outpatient follow-up with PCP.    Portal hypertension (HCC) No signs of bleeding  Hypothyroidism -previous admitted with TSH of 94 and this has improved down to 64.  -Per admitting physician patient has not yet started levothyroxine  since did not receive from pharmacy until day of admission. -Patient was maintained on levothyroxine during the hospitalization.  -Will need TFTs in 4 to 6 weeks. -Outpatient follow-up with PCP.  Cirrhosis of liver ([place]) -secondary to autoimmune hepatitis Follows at [facility]. Not a transplant candidate due to MELD score and morbid obesity. -Patient was maintained on home regimen rifaximin, lactulose and azathioprine  -Outpatient follow-up.        Procedures: CT abd/pelvis 02/24/2022 CT head 02/24/2022  Consultations: None  Discharge Exam: Vitals:   02/26/22 1938 02/27/22 0522  BP: (!) 137/52 (!) 134/48  Pulse: 62 66  Resp: 16 16  Temp: 98.1 F (36.7 C) 98 F (36.7 C)  SpO2: 97% 98%    General: NAD Cardiovascular: RRR no murmurs rubs or gallops.  No JVD.  No lower extremity edema. Respiratory: Clear to auscultation bilaterally.  No wheezes, no crackles, no rhonchi.  Fair air movement.  Speaking in full sentences.  Discharge Instructions   Discharge Instructions     Diet - low sodium heart healthy   Complete by: As directed    Increase activity slowly   Complete by: As directed       Allergies as of 02/27/2022       Reactions   Aspirin Hives   Propoxyphene Anaphylaxis   Tramadol Anaphylaxis   Morphine And Related Nausea And Vomiting   Tylenol  [acetaminophen] Other (See Comments)   Was told not to take due to liver   Prednisone Rash        Medication List     TAKE these medications    azaTHIOprine 50 MG tablet Commonly known as: IMURAN Take 100 mg by mouth daily in the afternoon.   cefdinir 300 MG capsule Commonly known as: OMNICEF Take 1 capsule (300 mg total) by mouth every 12 (twelve) hours for 2 days.   diphenhydrAMINE 25 mg capsule Commonly known as: BENADRYL Take 25 mg by mouth every 8 (eight) hours as needed for allergies or itching.   escitalopram 20 MG tablet Commonly known as: LEXAPRO Take 20 mg by mouth daily in the afternoon.   ferrous sulfate 325 (65 FE) MG tablet Take 325 mg by mouth daily in the afternoon.   ibuprofen 400 MG tablet Commonly known as: ADVIL Take 1 tablet (400 mg total) by mouth every 6 (six) hours as needed for fever, headache or mild pain.   IMMUNE FORMULA PO Take 2 tablets by mouth daily in the afternoon. Immune Plus   L-CARNITINE PO Take 1 tablet by mouth daily in the afternoon.   lactulose 10 GM/15ML solution Commonly known as: CHRONULAC Take 45 mLs (30 g total) by mouth  3 (three) times daily.   levothyroxine 100 MCG tablet Commonly known as: SYNTHROID Take 100 mcg by mouth daily at 6 (six) AM.   Lubricant Eye Drops 0.4-0.3 % Soln Generic drug: Polyethyl Glycol-Propyl Glycol Place 1-2 drops into both eyes 3 (three) times daily as needed (dry/irritated eyes).   ondansetron 8 MG tablet Commonly known as: ZOFRAN Take 8 mg by mouth every 8 (eight) hours as needed for nausea or vomiting.   primidone 50 MG tablet Commonly known as: MYSOLINE Take 50 mg by mouth daily in the afternoon.   rifaximin 550 MG Tabs tablet Commonly known as: XIFAXAN Take 1 tablet (550 mg total) by mouth 2 (two) times daily.   traZODone 50 MG tablet Commonly known as: DESYREL Take 50 mg by mouth at bedtime.   Zinc 100 MG Tabs Take 200 mg by mouth daily in the afternoon.        Allergies  Allergen Reactions   Aspirin Hives   Propoxyphene Anaphylaxis   Tramadol Anaphylaxis   Morphine And Related Nausea And Vomiting   Tylenol [Acetaminophen] Other (See Comments)    Was told not to take due to liver   Prednisone Rash    Follow-up Information     Hamrick, Maura L, MD. Schedule an appointment as soon as possible for a visit in 2 week(s).   Specialty: Family Medicine Contact information: [place] [address] [phone]                  The results of significant diagnostics from this hospitalization (including imaging, microbiology, ancillary and laboratory) are listed below for reference.    Significant Diagnostic Studies: CT ABDOMEN PELVIS W CONTRAST  Result Date: 02/24/2022 CLINICAL DATA:  Abdominal pain, acute, nonlocalized EXAM: CT ABDOMEN AND PELVIS WITH CONTRAST TECHNIQUE: Multidetector CT imaging of the abdomen and pelvis was performed using the standard protocol following bolus administration of intravenous contrast. RADIATION DOSE REDUCTION: This exam was performed according to the departmental dose-optimization program which includes automated exposure control, adjustment of the mA and/or kV according to patient size and/or use of iterative reconstruction technique. CONTRAST:  139mL OMNIPAQUE IOHEXOL 300 MG/ML  SOLN COMPARISON:  None Available. FINDINGS: Lower chest: No acute abnormality. Hepatobiliary: Nodular hepatic contour. No focal liver abnormality. Status post cholecystectomy. No biliary dilatation. Pancreas: No focal lesion. Normal pancreatic contour. No surrounding inflammatory changes. No main pancreatic ductal dilatation. Spleen: The spleen is enlarged in caliber.  No focal lesion. Adrenals/Urinary Tract: No adrenal nodule bilaterally. Bilateral kidneys enhance symmetrically. Fetal lobulation bilaterally. No hydronephrosis. No hydroureter. The urinary bladder is unremarkable. Stomach/Bowel: Stomach is within normal  limits. No evidence of bowel wall thickening or dilatation. Appendix appears normal. Vascular/Lymphatic: Paraesophageal varices. Left upper quadrant venous collaterals measuring up to at least 1.5 cm in caliber. Left splenorenal shunt. Recanalized paraumbilical vein. No abdominal aorta or iliac aneurysm. Mild atherosclerotic plaque of the aorta and its branches. No abdominal, pelvic, or inguinal lymphadenopathy. Reproductive: Uterus and bilateral adnexa are unremarkable. Other: No intraperitoneal free fluid. No intraperitoneal free gas. No organized fluid collection. Musculoskeletal: No abdominal wall hernia or abnormality. No suspicious lytic or blastic osseous lesions. No acute displaced fracture. IMPRESSION: 1. Cirrhosis with portal hypertension. No focal liver lesions identified. Please note that liver protocol enhanced MR and CT are the most sensitive tests for the screening detection of hepatocellular carcinoma in the high risk setting of cirrhosis. 2.  Aortic Atherosclerosis (ICD10-I70.0). Electronically Signed   By: Clelia Croft.D.  On: 02/24/2022 23:13   CT HEAD WO CONTRAST  Result Date: 02/24/2022 CLINICAL DATA:  Confusion and vomiting EXAM: CT HEAD WITHOUT CONTRAST TECHNIQUE: Contiguous axial images were obtained from the base of the skull through the vertex without intravenous contrast. RADIATION DOSE REDUCTION: This exam was performed according to the departmental dose-optimization program which includes automated exposure control, adjustment of the mA and/or kV according to patient size and/or use of iterative reconstruction technique. COMPARISON:  None Available.  02/16/2022 FINDINGS: Brain: No evidence of acute infarction, hemorrhage, mass, mass effect, or midline shift. No hydrocephalus or extra-axial fluid collection. Vascular: No hyperdense vessel. Skull: Normal. Negative for fracture or focal lesion. Sinuses/Orbits: No acute finding. Other: Trace fluid in left mastoid air cells.  IMPRESSION: No acute intracranial process. Electronically Signed   By: Merilyn Baba M.D.   On: 02/24/2022 18:52   US RENAL  Result Date: 02/16/2022 CLINICAL DATA:  Acute kidney injury. EXAM: RENAL / URINARY TRACT ULTRASOUND COMPLETE COMPARISON:  None Available. FINDINGS: Right Kidney: Renal measurements: 11.8 cm x 4.7 cm x 5.0 cm = volume: 145.5 mL. Echogenicity within normal limits. A 2.8 cm x 1.9 cm x 2.1 cm cyst is seen within the right kidney. No abnormal flow is seen within this region on color Doppler evaluation. No hydronephrosis is visualized. Left Kidney: Renal measurements: 10.1 cm x 4.6 cm x 5.9 cm = volume: 144.7 mL. Echogenicity within normal limits. No mass or hydronephrosis visualized. Bladder: Appears normal for degree of bladder distention. The bilateral ureteral jets are visualized. Other: Limited study secondary to the patient's body habitus and overlying bowel gas. IMPRESSION: Right renal cyst. Electronically Signed   By: Virgina Norfolk M.D.   On: 02/16/2022 20:15   CT HEAD WO CONTRAST  Result Date: 02/16/2022 CLINICAL DATA:  A 56 year old female presents with mental status changes of unknown cause. EXAM: CT HEAD WITHOUT CONTRAST TECHNIQUE: Contiguous axial images were obtained from the base of the skull through the vertex without intravenous contrast. RADIATION DOSE REDUCTION: This exam was performed according to the departmental dose-optimization program which includes automated exposure control, adjustment of the mA and/or kV according to patient size and/or use of iterative reconstruction technique. COMPARISON:  December 31, 2018 and November of 2022 FINDINGS: Brain: No evidence of acute infarction, hemorrhage, hydrocephalus, extra-axial collection or mass lesion/mass effect. Vascular: No hyperdense vessel or unexpected calcification. Skull: Normal. Negative for fracture or focal lesion. Sinuses/Orbits: Patchy opacification of ethmoid sinuses with mild mucosal thickening of LEFT  sphenoid sinus. Sinus is incompletely imaged as are the orbits and areas otherwise negative for acute findings. Small LEFT mastoid effusion unchanged from previous imaging. Other: None. IMPRESSION: 1. No acute intracranial abnormality. 2. Mild sinus disease. Electronically Signed   By: Zetta Bills M.D.   On: 02/16/2022 08:56    Microbiology: Recent Results (from the past 240 hour(s))  Urine Culture     Status: Abnormal   Collection Time: 02/24/22  8:11 PM   Specimen: Urine, Clean Catch  Result Value Ref Range Status   Specimen Description   Final    URINE, CLEAN CATCH Performed at [facility], [place] [address] [place], [place] [address]    Special Requests   Final    NONE Performed at [facility], [place] [address], [place], [place] [address]    Culture MULTIPLE SPECIES PRESENT, SUGGEST RECOLLECTION (A)  Final   Report Status 02/26/2022 FINAL  Final  Resp Panel by RT-PCR (Flu A&B, Covid) Anterior Nasal Swab  Status: None   Collection Time: 02/24/22  8:38 PM   Specimen: Anterior Nasal Swab  Result Value Ref Range Status   SARS Coronavirus 2 by RT PCR NEGATIVE NEGATIVE Final    Comment: (NOTE) SARS-CoV-2 target nucleic acids are NOT DETECTED.  The SARS-CoV-2 RNA is generally detectable in upper respiratory specimens during the acute phase of infection. The lowest concentration of SARS-CoV-2 viral copies this assay can detect is 138 copies/mL. A negative result does not preclude SARS-Cov-2 infection and should not be used as the sole basis for treatment or other patient management decisions. A negative result may occur with  improper specimen collection/handling, submission of specimen other than nasopharyngeal swab, presence of viral mutation(s) within the areas targeted by this assay, and inadequate number of viral copies(<138 copies/mL). A negative result must be combined with clinical observations, patient history, and  epidemiological information. The expected result is Negative.  Fact Sheet for Patients:  EntrepreneurPulse.com.au  Fact Sheet for Healthcare Providers:  IncredibleEmployment.be  This test is no t yet approved or cleared by the [place] FDA and  has been authorized for detection and/or diagnosis of SARS-CoV-2 by FDA under an Emergency Use Authorization (EUA). This EUA will remain  in effect (meaning this test can be used) for the duration of the COVID-19 declaration under Section 564(b)(1) of the Act, 21 U.S.C.section 360bbb-3(b)(1), unless the authorization is terminated  or revoked sooner.       Influenza A by PCR NEGATIVE NEGATIVE Final   Influenza B by PCR NEGATIVE NEGATIVE Final    Comment: (NOTE) The Xpert Xpress SARS-CoV-2/FLU/RSV plus assay is intended as an aid in the diagnosis of influenza from Nasopharyngeal swab specimens and should not be used as a sole basis for treatment. Nasal washings and aspirates are unacceptable for Xpert Xpress SARS-CoV-2/FLU/RSV testing.  Fact Sheet for Patients: EntrepreneurPulse.com.au  Fact Sheet for Healthcare Providers: IncredibleEmployment.be  This test is not yet approved or cleared by the [place] FDA and has been authorized for detection and/or diagnosis of SARS-CoV-2 by FDA under an Emergency Use Authorization (EUA). This EUA will remain in effect (meaning this test can be used) for the duration of the COVID-19 declaration under Section 564(b)(1) of the Act, 21 U.S.C. section 360bbb-3(b)(1), unless the authorization is terminated or revoked.  Performed at [facility], [place] [address] [place], [place] [address]      Labs: Basic Metabolic Panel: Recent Labs  Lab 02/24/22 1752 02/25/22 0508 02/26/22 0514 02/27/22 0501  NA 139 141 138 140  K 3.2* 3.4* 3.5 4.0  CL 113* 115* 115* 112*  CO2 22 23 21* 25  GLUCOSE 94 90  84 97  BUN 9 9 10 12   CREATININE 0.92 0.95 1.00 0.97  CALCIUM 8.0* 8.1* 7.9* 8.0*  MG 2.3  --  2.1  --    Liver Function Tests: Recent Labs  Lab 02/24/22 1752 02/26/22 0514  AST 57* 48*  ALT 34 27  ALKPHOS 79 63  BILITOT 1.8* 1.1  PROT 6.4* 5.2*  ALBUMIN 2.6* 2.1*   No results for input(s): "LIPASE", "AMYLASE" in the last 168 hours. Recent Labs  Lab 02/24/22 1749  AMMONIA 61*   CBC: Recent Labs  Lab 02/24/22 1752 02/25/22 0508 02/26/22 0514 02/27/22 0501  WBC 3.8* 2.4* 2.3* 2.4*  NEUTROABS 2.4  --  1.2* 1.2*  HGB 11.8* 10.4* 10.5* 10.2*  HCT 34.3* 32.2* 32.5* 31.7*  MCV 96.1 97.3 98.8 98.8  PLT 95* 74* 74* 71*  Cardiac Enzymes: No results for input(s): "CKTOTAL", "CKMB", "CKMBINDEX", "TROPONINI" in the last 168 hours. BNP: BNP (last 3 results) No results for input(s): "BNP" in the last 8760 hours.  ProBNP (last 3 results) No results for input(s): "PROBNP" in the last 8760 hours.  CBG: Recent Labs  Lab 02/24/22 1734  GLUCAP 91       Signed:  Irine Seal MD.  [facility] 02/27/2022, 10:41 AM

## 2022-03-04 DIAGNOSIS — K754 Autoimmune hepatitis: Secondary | ICD-10-CM | POA: Diagnosis not present

## 2022-03-04 DIAGNOSIS — E876 Hypokalemia: Secondary | ICD-10-CM | POA: Diagnosis not present

## 2022-03-04 DIAGNOSIS — D61818 Other pancytopenia: Secondary | ICD-10-CM | POA: Diagnosis not present

## 2022-03-04 DIAGNOSIS — E039 Hypothyroidism, unspecified: Secondary | ICD-10-CM | POA: Diagnosis not present

## 2022-03-04 DIAGNOSIS — K7460 Unspecified cirrhosis of liver: Secondary | ICD-10-CM | POA: Diagnosis not present

## 2022-03-09 ENCOUNTER — Ambulatory Visit
Admission: RE | Admit: 2022-03-09 | Discharge: 2022-03-09 | Disposition: A | Discharge status: Home / Self Care | Payer: Medicare Other | Source: Ambulatory Visit | Attending: Family Medicine | Admitting: Family Medicine

## 2022-03-09 DIAGNOSIS — Z1231 Encounter for screening mammogram for malignant neoplasm of breast: Secondary | ICD-10-CM | POA: Diagnosis not present

## 2022-04-07 DIAGNOSIS — E039 Hypothyroidism, unspecified: Secondary | ICD-10-CM | POA: Diagnosis not present

## 2022-04-25 IMAGING — CT CT ANKLE*R* W/O CM
1 series · 12 of 14 positions shown, 15 images · non-contrast
Comparison: 07/29/2021.

CLINICAL DATA: Ankle fracture.

EXAM:
CT OF THE RIGHT ANKLE WITHOUT CONTRAST
TECHNIQUE: Multidetector CT imaging of the right ankle was performed according
to the standard protocol. Multiplanar CT image reconstructions were
also generated.
RADIATION DOSE REDUCTION: This exam was performed according to the
departmental dose-optimization program which includes automated
exposure control, adjustment of the mA and/or kV according to
patient size and/or use of iterative reconstruction technique.

[Series 4: axial st · axial · 0.38mm/px · z∈[-1154,-1006]mm · 12 of 88 slices shown, 15 images]
[im 7/88  soft-tissue]
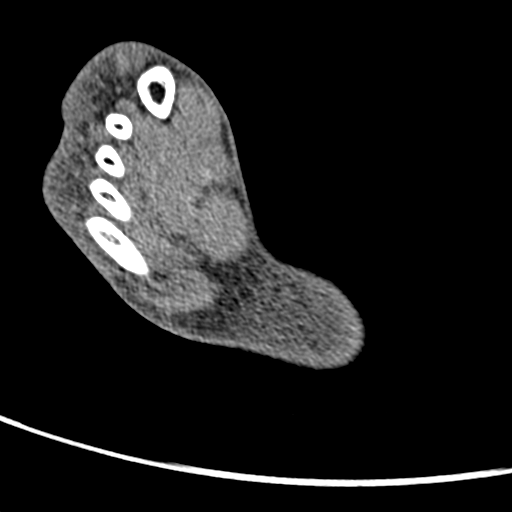
[im 7/88  bone]
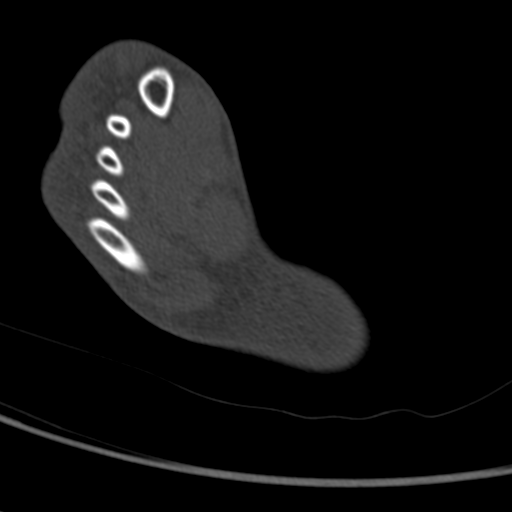
[im 14/88  bone]
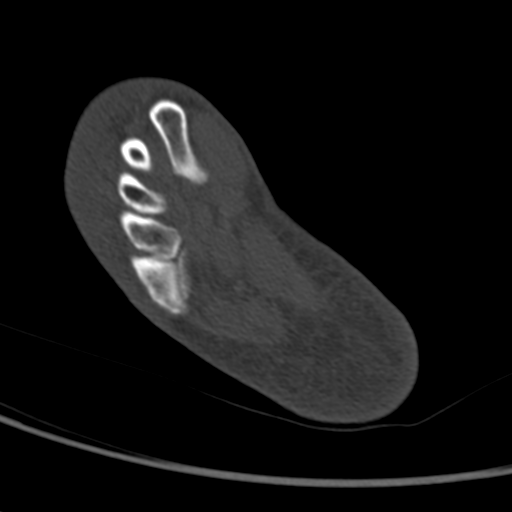
[im 21/88  bone]
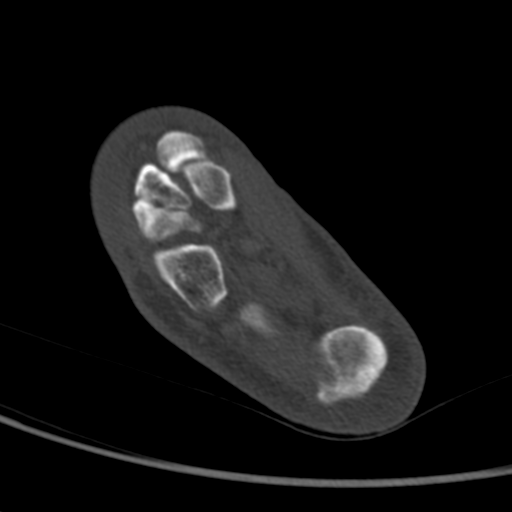
[im 27/88  bone]
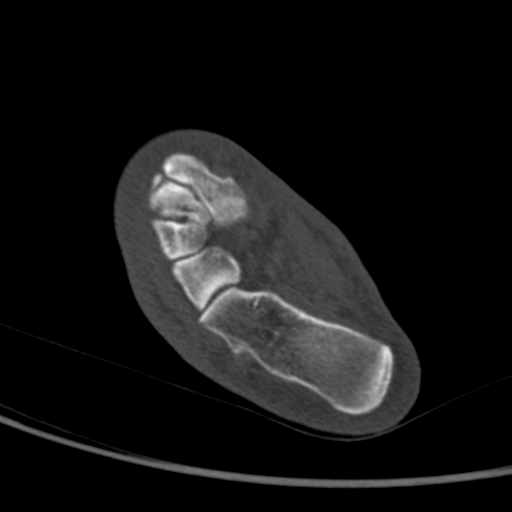
[im 34/88  soft-tissue]
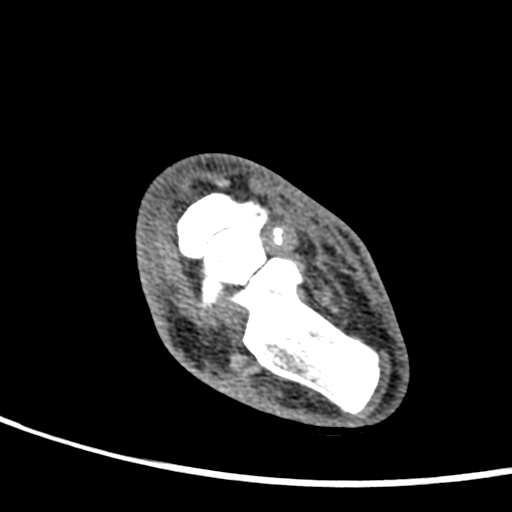
[im 34/88  bone]
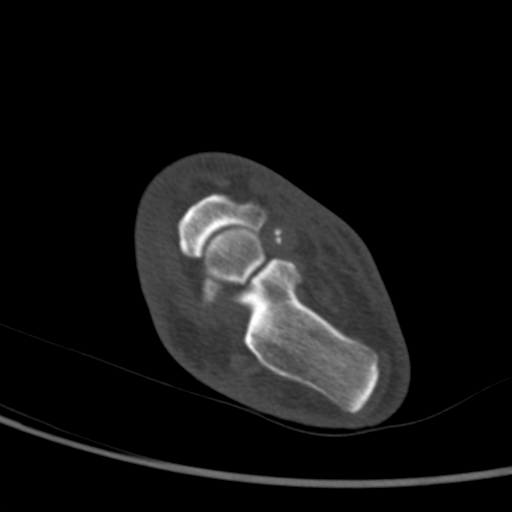
[im 41/88  bone]
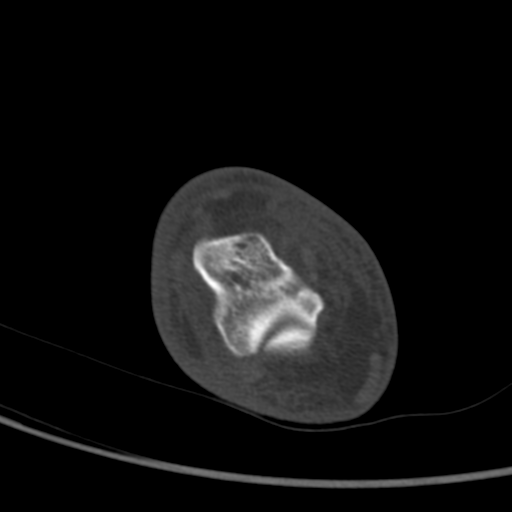
[im 47/88  bone]
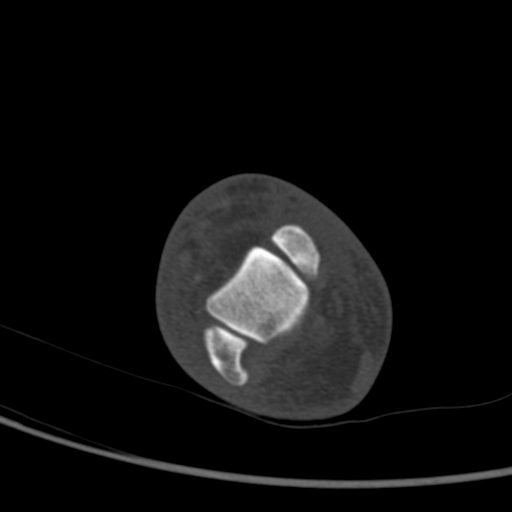
[im 54/88  bone]
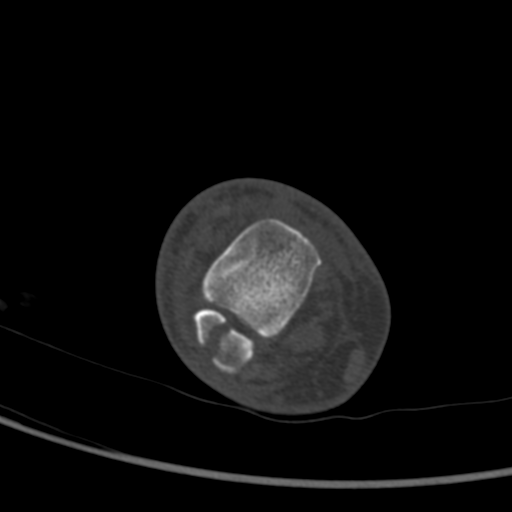
[im 61/88  soft-tissue]
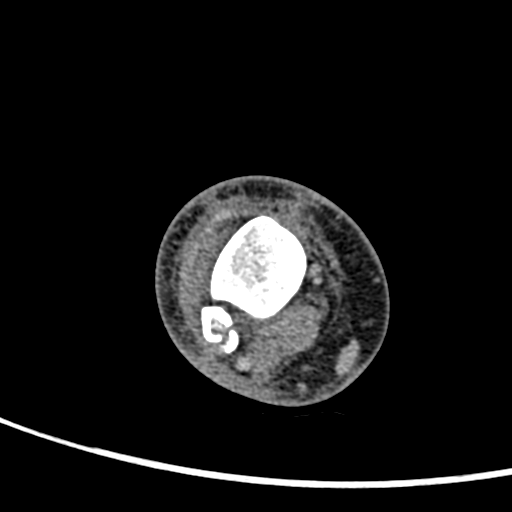
[im 61/88  bone]
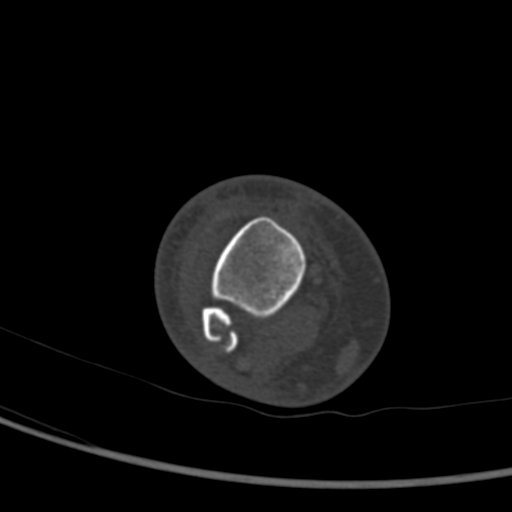
[im 67/88  bone]
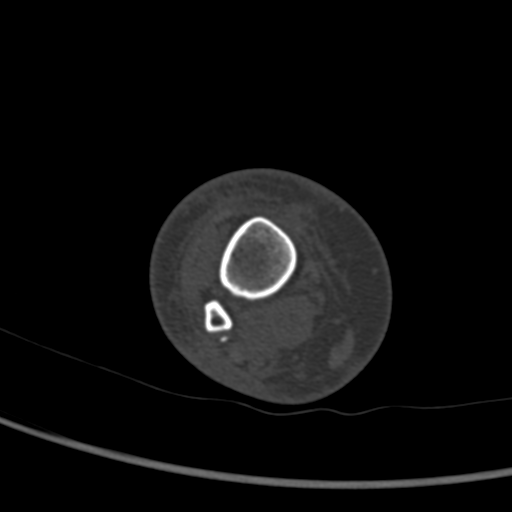
[im 74/88  bone]
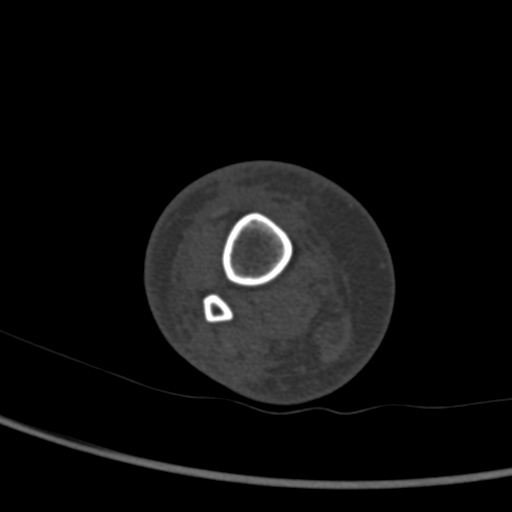
[im 81/88  bone]
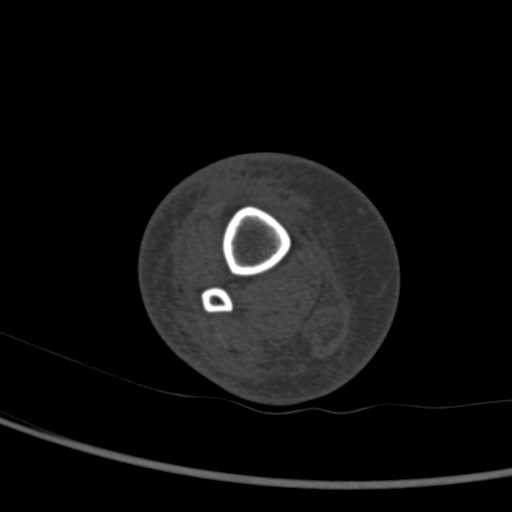

[12 of 14 positions shown; findings below may reference images not displayed]

FINDINGS: Bones/Joint/Cartilage

There is a mildly displaced oblique fracture of the distal fibula,
unchanged from the prior exam. A few calcifications are seen
inferior to the medial malleolus suggesting avulsion fractures.
There is widening of the tibiotalar joint laterally at the talar
dome suggesting instability and likely ligamental injury.
Degenerative changes are noted in the midfoot. Corticated bony
densities are noted in the soft tissues adjacent to the navicular
bone which are likely chronic.

Ligaments

Suboptimally assessed by CT.

Muscles and Tendons

The visualized musculotendinous structures are within normal limits.

Soft tissues

Subcutaneous edema and fat stranding are noted. No large focal
hematoma is identified.
IMPRESSION: 1. Mildly displaced oblique fracture of the lateral malleolus.
2. Tiny bony densities inferior to the medial malleolus suggesting
avulsion fracture.
3. There is widening of the tibiotalar joint laterally at the talar
dome which may be associated with instability and ligamental injury.

## 2022-05-22 DIAGNOSIS — R350 Frequency of micturition: Secondary | ICD-10-CM | POA: Diagnosis not present

## 2022-05-22 DIAGNOSIS — L918 Other hypertrophic disorders of the skin: Secondary | ICD-10-CM | POA: Diagnosis not present

## 2022-05-27 DIAGNOSIS — K7469 Other cirrhosis of liver: Secondary | ICD-10-CM | POA: Diagnosis not present

## 2022-05-27 DIAGNOSIS — K7682 Hepatic encephalopathy: Secondary | ICD-10-CM | POA: Diagnosis not present

## 2022-05-27 DIAGNOSIS — K754 Autoimmune hepatitis: Secondary | ICD-10-CM | POA: Diagnosis not present

## 2022-05-27 DIAGNOSIS — K766 Portal hypertension: Secondary | ICD-10-CM | POA: Diagnosis not present

## 2022-06-08 ENCOUNTER — Telehealth: Payer: Self-pay | Admitting: Gastroenterology

## 2022-06-08 NOTE — Telephone Encounter (Signed)
Hi Dr. Bryan Lemma,  Supervising Provider: 06/08/22.   We received a referral for patient to be evaluated for a colonoscopy and EGD. The patient has GI hx in [place] and all of her records are available for you to review within Care Everywhere.  Please advise on scheduling.    Thanks

## 2022-06-10 NOTE — Telephone Encounter (Signed)
Records in care everywhere reviewed.  She was actually just seen by [place] at [place] on 05/27/2022 for continued follow-up of biopsy-proven Autoimmune Hepatitis with cirrhosis.  Previously followed at [facility] [place] [facility] as well and maintained on azathioprine.  Has multiple hospitalizations for hepatic encephalopathy, treated with lactulose and rifaximin.  Referral placed to [facility]  from [place] for Upper Endoscopy for ongoing variceal screening.  Last EGD was 02/2018 at [facility] n/f [place] but without any esophageal varices.  Performed empiric esophageal dilation with transient improvement in dysphagia.  Last colonoscopy was 04/11/2018 at [place] and notable for 5-8 mm ascending colon polyp removed with snare, 3-5 mm ascending polyp removed with forceps, 1 cm descending polyp removed with hot snare and 1 clip, 20-25 mm pedunculated sigmoid polyp removed with hot snare and 1 clip.  Medium sized grade 2 internal hemorrhoids.  No path available for review.  Based on complex history, I believe it would be prudent to have OV first to discuss upper endoscopy and colonoscopy, then can schedule for these procedures.

## 2022-06-15 DIAGNOSIS — M549 Dorsalgia, unspecified: Secondary | ICD-10-CM | POA: Diagnosis not present

## 2022-06-15 DIAGNOSIS — W108XXA Fall (on) (from) other stairs and steps, initial encounter: Secondary | ICD-10-CM | POA: Diagnosis not present

## 2022-06-15 DIAGNOSIS — M25571 Pain in right ankle and joints of right foot: Secondary | ICD-10-CM | POA: Diagnosis not present

## 2022-06-18 ENCOUNTER — Encounter: Payer: Self-pay | Admitting: Gastroenterology

## 2022-06-18 NOTE — Telephone Encounter (Signed)
Called patient to advise no answer no mailbox set up.

## 2022-06-29 ENCOUNTER — Inpatient Hospital Stay: Admission: RE | Admit: 2022-06-29 | Payer: Medicare Other | Source: Ambulatory Visit

## 2022-07-06 DIAGNOSIS — G4700 Insomnia, unspecified: Secondary | ICD-10-CM | POA: Diagnosis not present

## 2022-07-06 DIAGNOSIS — E039 Hypothyroidism, unspecified: Secondary | ICD-10-CM | POA: Diagnosis not present

## 2022-07-06 DIAGNOSIS — K754 Autoimmune hepatitis: Secondary | ICD-10-CM | POA: Diagnosis not present

## 2022-07-06 DIAGNOSIS — D649 Anemia, unspecified: Secondary | ICD-10-CM | POA: Diagnosis not present

## 2022-07-06 DIAGNOSIS — K7460 Unspecified cirrhosis of liver: Secondary | ICD-10-CM | POA: Diagnosis not present

## 2022-07-06 DIAGNOSIS — G250 Essential tremor: Secondary | ICD-10-CM | POA: Diagnosis not present

## 2022-07-06 DIAGNOSIS — I4891 Unspecified atrial fibrillation: Secondary | ICD-10-CM | POA: Diagnosis not present

## 2022-07-06 DIAGNOSIS — D696 Thrombocytopenia, unspecified: Secondary | ICD-10-CM | POA: Diagnosis not present

## 2022-07-21 ENCOUNTER — Ambulatory Visit: Payer: Medicare Other | Admitting: Gastroenterology

## 2022-07-21 DIAGNOSIS — K7469 Other cirrhosis of liver: Secondary | ICD-10-CM | POA: Diagnosis not present

## 2022-07-21 DIAGNOSIS — K754 Autoimmune hepatitis: Secondary | ICD-10-CM | POA: Diagnosis not present

## 2022-07-22 DIAGNOSIS — M79621 Pain in right upper arm: Secondary | ICD-10-CM | POA: Diagnosis not present

## 2022-07-22 DIAGNOSIS — S0181XA Laceration without foreign body of other part of head, initial encounter: Secondary | ICD-10-CM | POA: Diagnosis not present

## 2022-07-22 DIAGNOSIS — Z888 Allergy status to other drugs, medicaments and biological substances status: Secondary | ICD-10-CM | POA: Diagnosis not present

## 2022-07-22 DIAGNOSIS — S01111A Laceration without foreign body of right eyelid and periocular area, initial encounter: Secondary | ICD-10-CM | POA: Diagnosis not present

## 2022-07-22 DIAGNOSIS — Z886 Allergy status to analgesic agent status: Secondary | ICD-10-CM | POA: Diagnosis not present

## 2022-07-22 DIAGNOSIS — S0003XA Contusion of scalp, initial encounter: Secondary | ICD-10-CM | POA: Diagnosis not present

## 2022-07-22 DIAGNOSIS — S0990XA Unspecified injury of head, initial encounter: Secondary | ICD-10-CM | POA: Diagnosis not present

## 2022-07-22 DIAGNOSIS — E079 Disorder of thyroid, unspecified: Secondary | ICD-10-CM | POA: Diagnosis not present

## 2022-07-22 DIAGNOSIS — Z885 Allergy status to narcotic agent status: Secondary | ICD-10-CM | POA: Diagnosis not present

## 2022-07-22 DIAGNOSIS — M79601 Pain in right arm: Secondary | ICD-10-CM | POA: Diagnosis not present

## 2022-07-22 DIAGNOSIS — M25511 Pain in right shoulder: Secondary | ICD-10-CM | POA: Diagnosis not present

## 2022-07-22 DIAGNOSIS — F32A Depression, unspecified: Secondary | ICD-10-CM | POA: Diagnosis not present

## 2022-07-22 DIAGNOSIS — S4991XA Unspecified injury of right shoulder and upper arm, initial encounter: Secondary | ICD-10-CM | POA: Diagnosis not present

## 2022-07-22 DIAGNOSIS — M542 Cervicalgia: Secondary | ICD-10-CM | POA: Diagnosis not present

## 2022-07-22 DIAGNOSIS — H538 Other visual disturbances: Secondary | ICD-10-CM | POA: Diagnosis not present

## 2022-07-29 DIAGNOSIS — Y92009 Unspecified place in unspecified non-institutional (private) residence as the place of occurrence of the external cause: Secondary | ICD-10-CM | POA: Diagnosis not present

## 2022-07-29 DIAGNOSIS — W19XXXA Unspecified fall, initial encounter: Secondary | ICD-10-CM | POA: Diagnosis not present

## 2022-07-29 DIAGNOSIS — R42 Dizziness and giddiness: Secondary | ICD-10-CM | POA: Diagnosis not present

## 2022-07-29 DIAGNOSIS — S40021A Contusion of right upper arm, initial encounter: Secondary | ICD-10-CM | POA: Diagnosis not present

## 2022-07-29 DIAGNOSIS — S01111A Laceration without foreign body of right eyelid and periocular area, initial encounter: Secondary | ICD-10-CM | POA: Diagnosis not present

## 2022-07-29 DIAGNOSIS — S0181XA Laceration without foreign body of other part of head, initial encounter: Secondary | ICD-10-CM | POA: Diagnosis not present

## 2022-08-01 IMAGING — DX DG CHEST 1V PORT
1 series · 1 of 1 positions shown · non-contrast
Comparison: March 22, 2021.

CLINICAL DATA: Sepsis.

EXAM:
PORTABLE CHEST 1 VIEW

[chest ap]
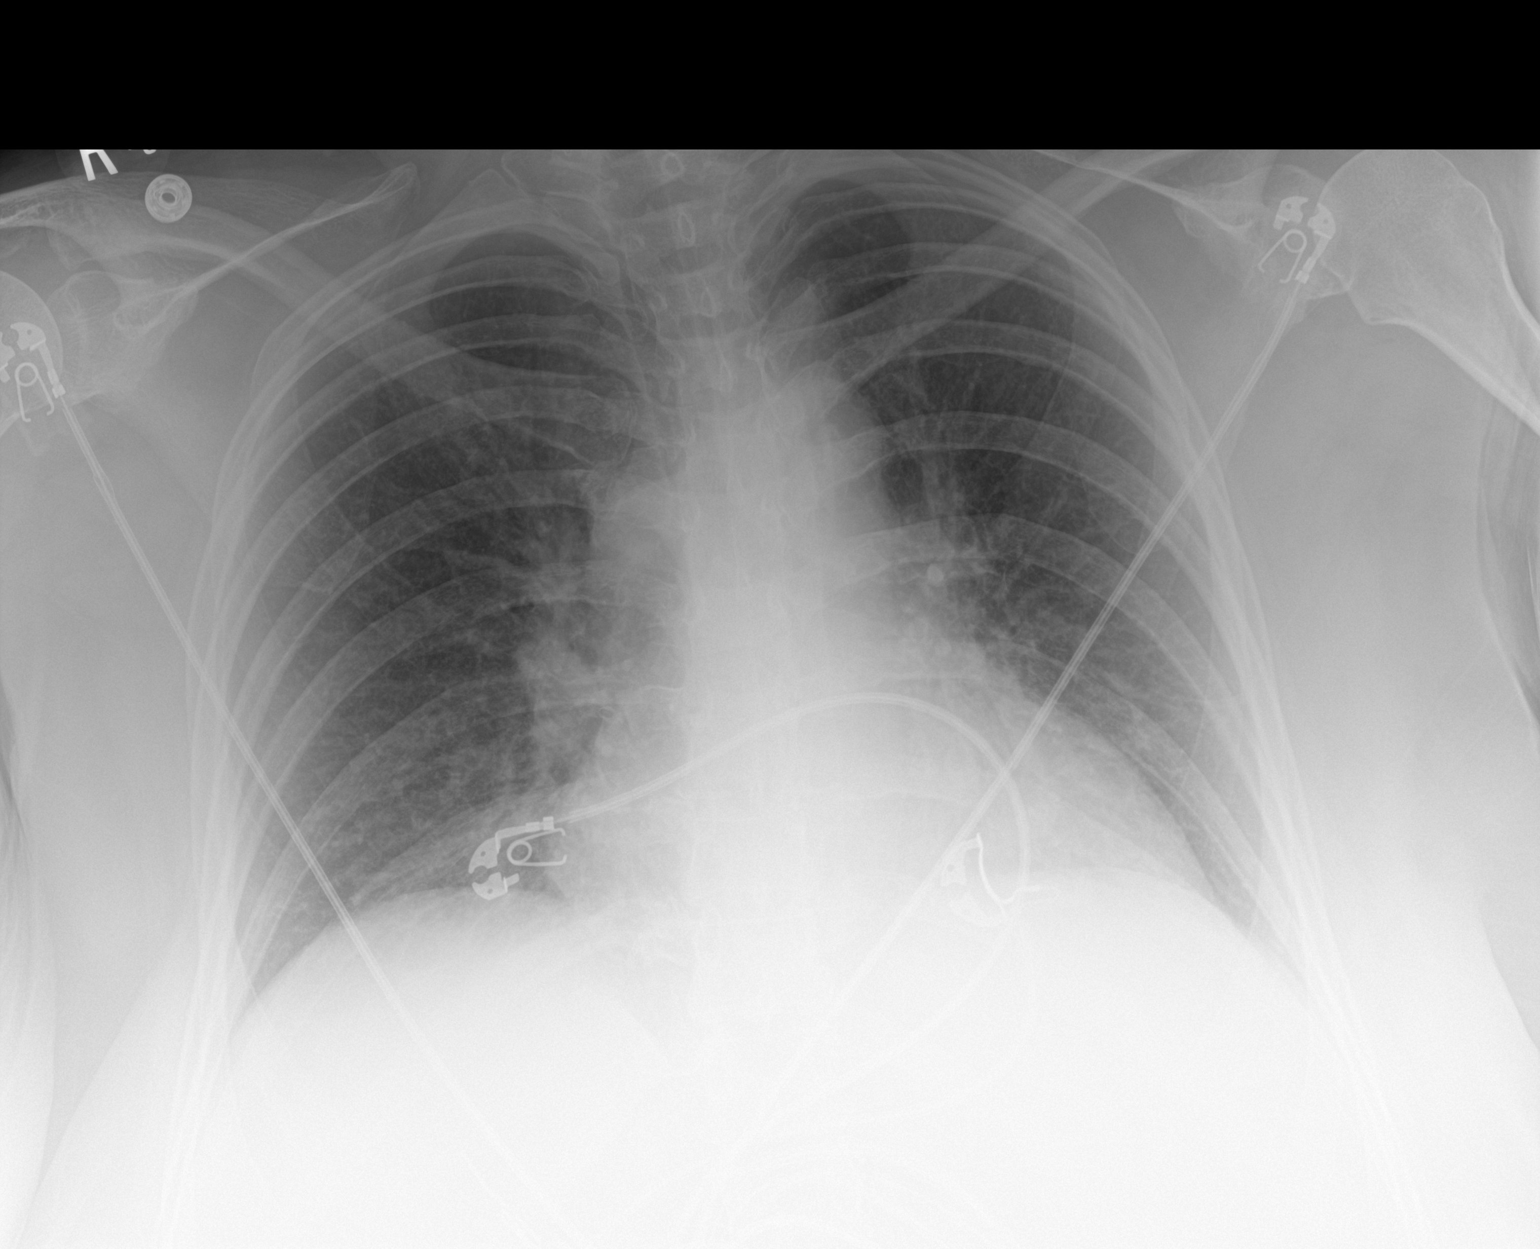

[1 of 1 positions shown; findings below may reference images not displayed]

FINDINGS: The heart size and mediastinal contours are within normal limits.
Both lungs are clear. The visualized skeletal structures are
unremarkable.
IMPRESSION: No active disease.

## 2022-09-25 ENCOUNTER — Ambulatory Visit: Payer: Medicare Other | Admitting: Gastroenterology

## 2022-10-09 ENCOUNTER — Ambulatory Visit: Payer: Medicare Other | Admitting: Gastroenterology

## 2022-12-01 ENCOUNTER — Inpatient Hospital Stay: Admission: RE | Admit: 2022-12-01 | Payer: Medicare Other | Source: Ambulatory Visit

## 2023-01-04 DIAGNOSIS — G250 Essential tremor: Secondary | ICD-10-CM | POA: Diagnosis not present

## 2023-01-04 DIAGNOSIS — E039 Hypothyroidism, unspecified: Secondary | ICD-10-CM | POA: Diagnosis not present

## 2023-01-04 DIAGNOSIS — I4891 Unspecified atrial fibrillation: Secondary | ICD-10-CM | POA: Diagnosis not present

## 2023-01-04 DIAGNOSIS — L989 Disorder of the skin and subcutaneous tissue, unspecified: Secondary | ICD-10-CM | POA: Diagnosis not present

## 2023-01-04 DIAGNOSIS — K7460 Unspecified cirrhosis of liver: Secondary | ICD-10-CM | POA: Diagnosis not present

## 2023-01-04 DIAGNOSIS — D649 Anemia, unspecified: Secondary | ICD-10-CM | POA: Diagnosis not present

## 2023-01-04 DIAGNOSIS — D696 Thrombocytopenia, unspecified: Secondary | ICD-10-CM | POA: Diagnosis not present

## 2023-01-04 DIAGNOSIS — K754 Autoimmune hepatitis: Secondary | ICD-10-CM | POA: Diagnosis not present

## 2023-01-04 DIAGNOSIS — G4700 Insomnia, unspecified: Secondary | ICD-10-CM | POA: Diagnosis not present

## 2023-05-26 DIAGNOSIS — K7469 Other cirrhosis of liver: Secondary | ICD-10-CM | POA: Diagnosis not present

## 2023-05-26 DIAGNOSIS — K754 Autoimmune hepatitis: Secondary | ICD-10-CM | POA: Diagnosis not present

## 2023-05-26 DIAGNOSIS — K766 Portal hypertension: Secondary | ICD-10-CM | POA: Diagnosis not present

## 2023-05-26 DIAGNOSIS — K7682 Hepatic encephalopathy: Secondary | ICD-10-CM | POA: Diagnosis not present

## 2023-07-29 DIAGNOSIS — D649 Anemia, unspecified: Secondary | ICD-10-CM | POA: Diagnosis not present

## 2023-07-29 DIAGNOSIS — R6889 Other general symptoms and signs: Secondary | ICD-10-CM | POA: Diagnosis not present

## 2023-07-29 DIAGNOSIS — E039 Hypothyroidism, unspecified: Secondary | ICD-10-CM | POA: Diagnosis not present

## 2023-07-29 DIAGNOSIS — G250 Essential tremor: Secondary | ICD-10-CM | POA: Diagnosis not present

## 2023-07-29 DIAGNOSIS — I4891 Unspecified atrial fibrillation: Secondary | ICD-10-CM | POA: Diagnosis not present

## 2023-07-29 DIAGNOSIS — K7460 Unspecified cirrhosis of liver: Secondary | ICD-10-CM | POA: Diagnosis not present

## 2023-07-29 DIAGNOSIS — G4700 Insomnia, unspecified: Secondary | ICD-10-CM | POA: Diagnosis not present

## 2023-07-29 DIAGNOSIS — D696 Thrombocytopenia, unspecified: Secondary | ICD-10-CM | POA: Diagnosis not present

## 2023-07-29 DIAGNOSIS — K754 Autoimmune hepatitis: Secondary | ICD-10-CM | POA: Diagnosis not present

## 2023-08-05 DIAGNOSIS — K7682 Hepatic encephalopathy: Secondary | ICD-10-CM | POA: Diagnosis not present

## 2023-08-05 DIAGNOSIS — Z9049 Acquired absence of other specified parts of digestive tract: Secondary | ICD-10-CM | POA: Diagnosis not present

## 2023-08-05 DIAGNOSIS — K754 Autoimmune hepatitis: Secondary | ICD-10-CM | POA: Diagnosis not present

## 2023-08-05 DIAGNOSIS — K7469 Other cirrhosis of liver: Secondary | ICD-10-CM | POA: Diagnosis not present

## 2023-08-05 DIAGNOSIS — K766 Portal hypertension: Secondary | ICD-10-CM | POA: Diagnosis not present

## 2023-09-23 DIAGNOSIS — E039 Hypothyroidism, unspecified: Secondary | ICD-10-CM | POA: Diagnosis not present

## 2023-11-26 ENCOUNTER — Other Ambulatory Visit (HOSPITAL_COMMUNITY): Payer: Self-pay | Admitting: Nurse Practitioner

## 2023-11-26 DIAGNOSIS — K7682 Hepatic encephalopathy: Secondary | ICD-10-CM

## 2023-11-26 DIAGNOSIS — K754 Autoimmune hepatitis: Secondary | ICD-10-CM

## 2023-11-26 DIAGNOSIS — K7469 Other cirrhosis of liver: Secondary | ICD-10-CM

## 2023-12-07 DIAGNOSIS — S93325A Dislocation of tarsometatarsal joint of left foot, initial encounter: Secondary | ICD-10-CM | POA: Diagnosis not present

## 2023-12-08 ENCOUNTER — Ambulatory Visit (HOSPITAL_COMMUNITY)

## 2023-12-10 DIAGNOSIS — S93325A Dislocation of tarsometatarsal joint of left foot, initial encounter: Secondary | ICD-10-CM | POA: Diagnosis not present

## 2023-12-14 ENCOUNTER — Ambulatory Visit (HOSPITAL_COMMUNITY)
Admission: RE | Admit: 2023-12-14 | Discharge: 2023-12-14 | Disposition: A | Discharge status: Home / Self Care | Source: Ambulatory Visit | Attending: Nurse Practitioner | Admitting: Nurse Practitioner

## 2023-12-14 DIAGNOSIS — K7460 Unspecified cirrhosis of liver: Secondary | ICD-10-CM | POA: Diagnosis not present

## 2023-12-14 DIAGNOSIS — K7469 Other cirrhosis of liver: Secondary | ICD-10-CM | POA: Diagnosis not present

## 2023-12-14 DIAGNOSIS — K766 Portal hypertension: Secondary | ICD-10-CM | POA: Diagnosis not present

## 2023-12-14 DIAGNOSIS — K7689 Other specified diseases of liver: Secondary | ICD-10-CM | POA: Diagnosis not present

## 2023-12-14 DIAGNOSIS — K7682 Hepatic encephalopathy: Secondary | ICD-10-CM | POA: Diagnosis not present

## 2023-12-14 DIAGNOSIS — K754 Autoimmune hepatitis: Secondary | ICD-10-CM | POA: Diagnosis not present

## 2023-12-14 DIAGNOSIS — Z9049 Acquired absence of other specified parts of digestive tract: Secondary | ICD-10-CM | POA: Diagnosis not present

## 2023-12-23 DIAGNOSIS — S93325A Dislocation of tarsometatarsal joint of left foot, initial encounter: Secondary | ICD-10-CM | POA: Diagnosis not present

## 2023-12-23 DIAGNOSIS — Z7409 Other reduced mobility: Secondary | ICD-10-CM | POA: Diagnosis not present

## 2024-01-05 DIAGNOSIS — K766 Portal hypertension: Secondary | ICD-10-CM | POA: Diagnosis not present

## 2024-01-05 DIAGNOSIS — S92812A Other fracture of left foot, initial encounter for closed fracture: Secondary | ICD-10-CM | POA: Diagnosis not present

## 2024-01-05 DIAGNOSIS — Z4789 Encounter for other orthopedic aftercare: Secondary | ICD-10-CM | POA: Diagnosis not present

## 2024-01-05 DIAGNOSIS — R9431 Abnormal electrocardiogram [ECG] [EKG]: Secondary | ICD-10-CM | POA: Diagnosis not present

## 2024-01-05 DIAGNOSIS — K7469 Other cirrhosis of liver: Secondary | ICD-10-CM | POA: Diagnosis not present

## 2024-01-05 DIAGNOSIS — Z885 Allergy status to narcotic agent status: Secondary | ICD-10-CM | POA: Diagnosis not present

## 2024-01-05 DIAGNOSIS — Z886 Allergy status to analgesic agent status: Secondary | ICD-10-CM | POA: Diagnosis not present

## 2024-01-05 DIAGNOSIS — K754 Autoimmune hepatitis: Secondary | ICD-10-CM | POA: Diagnosis not present

## 2024-01-05 DIAGNOSIS — G8918 Other acute postprocedural pain: Secondary | ICD-10-CM | POA: Diagnosis not present

## 2024-01-05 DIAGNOSIS — S93325A Dislocation of tarsometatarsal joint of left foot, initial encounter: Secondary | ICD-10-CM | POA: Diagnosis not present

## 2024-01-05 DIAGNOSIS — I4891 Unspecified atrial fibrillation: Secondary | ICD-10-CM | POA: Diagnosis not present

## 2024-02-18 DIAGNOSIS — Z4889 Encounter for other specified surgical aftercare: Secondary | ICD-10-CM | POA: Diagnosis not present

## 2024-02-18 DIAGNOSIS — S93325D Dislocation of tarsometatarsal joint of left foot, subsequent encounter: Secondary | ICD-10-CM | POA: Diagnosis not present

## 2024-06-16 ENCOUNTER — Other Ambulatory Visit: Payer: Self-pay | Admitting: Nurse Practitioner

## 2024-06-16 DIAGNOSIS — K754 Autoimmune hepatitis: Secondary | ICD-10-CM

## 2024-06-16 DIAGNOSIS — K7469 Other cirrhosis of liver: Secondary | ICD-10-CM

## 2024-06-16 DIAGNOSIS — K7682 Hepatic encephalopathy: Secondary | ICD-10-CM
